# Patient Record
Sex: Female | Born: 1995 | Race: White | Hispanic: No | Marital: Single | Smoking: Never smoker
Health system: Southern US, Community
[De-identification: ages and names within clinical notes are randomized; demographics above are authoritative.]

## PROBLEM LIST (undated history)

## (undated) DIAGNOSIS — B2790 Infectious mononucleosis, unspecified without complication: Secondary | ICD-10-CM

## (undated) HISTORY — PX: OTHER SURGICAL HISTORY: SHX169

---
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
## 2016-06-27 ENCOUNTER — Emergency Department
Admission: EM | Admit: 2016-06-27 | Discharge: 2016-06-27 | Disposition: A | Discharge status: Home / Self Care | Payer: Medicaid Other | Attending: Emergency Medicine | Admitting: Emergency Medicine

## 2016-06-27 ENCOUNTER — Encounter: Payer: Self-pay | Admitting: Emergency Medicine

## 2016-06-27 DIAGNOSIS — Z79899 Other long term (current) drug therapy: Secondary | ICD-10-CM | POA: Diagnosis not present

## 2016-06-27 DIAGNOSIS — R509 Fever, unspecified: Secondary | ICD-10-CM | POA: Diagnosis present

## 2016-06-27 DIAGNOSIS — K1379 Other lesions of oral mucosa: Secondary | ICD-10-CM | POA: Diagnosis not present

## 2016-06-27 DIAGNOSIS — K148 Other diseases of tongue: Secondary | ICD-10-CM

## 2016-06-27 LAB — INFLUENZA PANEL BY PCR (TYPE A & B)
INFLAPCR: NEGATIVE
Influenza B By PCR: NEGATIVE

## 2016-06-27 LAB — POCT RAPID STREP A: Streptococcus, Group A Screen (Direct): NEGATIVE

## 2016-06-27 MED ORDER — LIDOCAINE VISCOUS 2 % MT SOLN
15.0000 mL | Freq: Once | OROMUCOSAL | Status: AC
  Administered 2016-06-27: 15 mL via OROMUCOSAL
  Filled 2016-06-27: qty 15

## 2016-06-27 MED ORDER — CLINDAMYCIN HCL 300 MG PO CAPS: 300.0000 mg | ORAL_CAPSULE | Freq: Three times a day (TID) | ORAL | 0 refills | Status: AC

## 2016-06-27 MED ORDER — DEXAMETHASONE 1 MG/ML PO CONC
10.0000 mg | Freq: Once | ORAL | Status: AC
  Administered 2016-06-27: 10 mg via ORAL
  Filled 2016-06-27: qty 10

## 2016-06-27 MED ORDER — MAGIC MOUTHWASH W/LIDOCAINE: 5.0000 mL | Freq: Three times a day (TID) | ORAL | 0 refills | Status: DC

## 2016-06-27 MED ORDER — CLINDAMYCIN HCL 150 MG PO CAPS
300.0000 mg | ORAL_CAPSULE | Freq: Once | ORAL | Status: AC
  Administered 2016-06-27: 300 mg via ORAL
  Filled 2016-06-27: qty 2

## 2016-06-27 NOTE — ED Triage Notes (Addendum)
Pt ambulatory to triage with steady gait, no distress noted. Pt c/o fever, neck tenderness and sore on tongue x2 days. Pt denies N/V/D.

## 2016-06-27 NOTE — ED Provider Notes (Signed)
[facility] Emergency Department Provider Note   ____________________________________________   First MD Initiated Contact with Patient 06/27/16 0532     (approximate)  I have reviewed the triage vital signs and the nursing notes.   HISTORY  Chief Complaint Fever; Mouth Lesions; and Sore Throat    HPI Krista Huffman is a 21 y.o. female who comes into the hospital today with a fever. She reports on Sunday was up to 102. She reports that she had some pain in her tongue and when she looked it was completely white. She reports that she saw spots on her tongue on Monday. She's been taking Motrin but reports is not helping. She reports that her temperature was improved with the spot on her tongue has been bothering her. Any He tries to eat hurts. The patient also reports that her throat has been hurting her. The patient rates her pain 8 out of 10 in intensity. She denies any sick contacts. She has had mono in the past but has never had anything like this before. She is here today for evaluation.   Past Medical History:  Diagnosis Date  . Mononucleosis     There are no active problems to display for this patient.   History reviewed. No pertinent surgical history.  Prior to Admission medications   Medication Sig Start Date End Date Taking? Authorizing Provider  acetaminophen-codeine (TYLENOL #3) 300-30 MG per tablet Take 1 tablet by mouth every 8 (eight) hours as needed for moderate pain. 01/07/15   Jenise V Bacon Menshew, PA-C  Alum & Mag Hydroxide-Simeth (MAGIC MOUTHWASH W/LIDOCAINE) SOLN Take 5 mLs by mouth 4 (four) times daily. 01/11/15   Joni Reining, PA-C  clindamycin (CLEOCIN) 300 MG capsule Take 1 capsule (300 mg total) by mouth 3 (three) times daily. 06/27/16 07/07/16  Rebecka Apley, MD  ibuprofen (ADVIL,MOTRIN) 200 MG tablet Take 200 mg by mouth every 6 (six) hours as needed for mild pain or moderate pain.    Historical Provider, MD  magic  mouthwash w/lidocaine SOLN Take 5 mLs by mouth 3 (three) times daily. 06/27/16   Rebecka Apley, MD  oxyCODONE (ROXICODONE) 5 MG/5ML solution Take 5 mLs (5 mg total) by mouth every 6 (six) hours as needed for severe pain. 03/14/15   Governor Rooks, MD  predniSONE (DELTASONE) 10 MG tablet Take 1 tablet (10 mg total) by mouth 2 (two) times daily with a meal. 01/07/15   Charlesetta Ivory Menshew, PA-C    Allergies Patient has no known allergies.  History reviewed. No pertinent family history.  Social History Social History  Substance Use Topics  . Smoking status: Never Smoker  . Smokeless tobacco: Never Used  . Alcohol use No    Review of Systems Constitutional:  fever/chills Eyes: No visual changes. ENT:  sore throat, mouth lesion Cardiovascular: Denies chest pain. Respiratory: Denies shortness of breath. Gastrointestinal: No abdominal pain.  No nausea, no vomiting.  No diarrhea.  No constipation. Genitourinary: Negative for dysuria. Musculoskeletal: Negative for back pain. Skin: Negative for rash. Neurological: Negative for headaches, focal weakness or numbness.  10-point ROS otherwise negative.  ____________________________________________   PHYSICAL EXAM:  VITAL SIGNS: ED Triage Vitals  Enc Vitals Group     BP 06/27/16 0415 122/79     Pulse Rate 06/27/16 0415 85     Resp 06/27/16 0415 16     Temp 06/27/16 0415 97.6 F (36.4 C)     Temp Source 06/27/16 0415 Oral  SpO2 06/27/16 0415 98 %     Weight 06/27/16 0415 214 lb (97.1 kg)     Height 06/27/16 0415 5\' 8"  (1.727 m)     Head Circumference --      Peak Flow --      Pain Score 06/27/16 0527 6     Pain Loc --      Pain Edu? --      Excl. in GC? --     Constitutional: Alert and oriented. Well appearing and in Moderate distress. Eyes: Conjunctivae are normal. PERRL. EOMI. Head: Atraumatic. Nose: No congestion/rhinnorhea. Mouth/Throat: Mucous membranes are moist.  Oropharynx non-erythematous. Patient does have  an oval circumscribed appearing lesion in the middle of her tongue. There is some increased whiteness in the middle of the area. It is tender to palpation at his approximately 2 x 3 cm. It is mildly erythematous. Cardiovascular: Normal rate, regular rhythm. Grossly normal heart sounds.  Good peripheral circulation. Respiratory: Normal respiratory effort.  No retractions. Lungs CTAB. Gastrointestinal: Soft and nontender. No distention. Positive bowel sounds Musculoskeletal: No lower extremity tenderness nor edema.   Neurologic:  Normal speech and language.  Skin:  Skin is warm, dry and intact.  Psychiatric: Mood and affect are normal.   ____________________________________________   LABS (all labs ordered are listed, but only abnormal results are displayed)  Labs Reviewed  CULTURE, GROUP A STREP ([facility])  INFLUENZA PANEL BY PCR (TYPE A & B)  POCT RAPID STREP A   ____________________________________________  EKG  none ____________________________________________  RADIOLOGY  none ____________________________________________   PROCEDURES  Procedure(s) performed: None  Procedures  Critical Care performed: No  ____________________________________________   INITIAL IMPRESSION / ASSESSMENT AND PLAN / ED COURSE  Pertinent labs & imaging results that were available during my care of the patient were reviewed by me and considered in my medical decision making (see chart for details).  Did do a strep and a flu swab on the patient. I gave her a dose of tramadol as well as dexamethasone and viscous lidocaine. The patient also received a dose of clindamycin. The lesion does not have a typical appearing of herpes outbreak. I we'll treat the patient was some clindamycin for the possibility of an infection and give her some Magic mouthwash for home. The patient should follow-up with her arm a care physician for further evaluation. If the lesion remains or she has any worsening symptoms  she should return to emergency department for further evaluation.      ____________________________________________   FINAL CLINICAL IMPRESSION(S) / ED DIAGNOSES  Final diagnoses:  Fever, unspecified fever cause  Tongue lesion      NEW MEDICATIONS STARTED DURING THIS VISIT:  New Prescriptions   CLINDAMYCIN (CLEOCIN) 300 MG CAPSULE    Take 1 capsule (300 mg total) by mouth 3 (three) times daily.   MAGIC MOUTHWASH W/LIDOCAINE SOLN    Take 5 mLs by mouth 3 (three) times daily.     Note:  This document was prepared using Dragon voice recognition software and may include unintentional dictation errors.    Rebecka ApleyAllison P Ayven Pheasant, MD 06/27/16 [phone]

## 2016-06-29 LAB — CULTURE, GROUP A STREP (THRC)

## 2016-08-29 ENCOUNTER — Encounter: Payer: Self-pay | Admitting: Emergency Medicine

## 2016-08-29 ENCOUNTER — Emergency Department
Admission: EM | Admit: 2016-08-29 | Discharge: 2016-08-29 | Disposition: A | Discharge status: Home / Self Care | Payer: Medicaid Other | Attending: Emergency Medicine | Admitting: Emergency Medicine

## 2016-08-29 ENCOUNTER — Emergency Department: Payer: Medicaid Other

## 2016-08-29 DIAGNOSIS — Z79899 Other long term (current) drug therapy: Secondary | ICD-10-CM | POA: Insufficient documentation

## 2016-08-29 DIAGNOSIS — J029 Acute pharyngitis, unspecified: Secondary | ICD-10-CM | POA: Diagnosis not present

## 2016-08-29 LAB — CBC WITH DIFFERENTIAL/PLATELET
BASOS ABS: 0.1 10*3/uL (ref 0–0.1)
BASOS PCT: 0 %
EOS ABS: 0 10*3/uL (ref 0–0.7)
EOS PCT: 0 %
HCT: 38.9 % (ref 35.0–47.0)
HEMOGLOBIN: 13.4 g/dL (ref 12.0–16.0)
LYMPHS ABS: 1.9 10*3/uL (ref 1.0–3.6)
Lymphocytes Relative: 8 %
MCH: 28.8 pg (ref 26.0–34.0)
MCHC: 34.4 g/dL (ref 32.0–36.0)
MCV: 83.8 fL (ref 80.0–100.0)
Monocytes Absolute: 1.3 10*3/uL — ABNORMAL HIGH (ref 0.2–0.9)
Monocytes Relative: 5 %
NEUTROS PCT: 87 %
Neutro Abs: 20.2 10*3/uL — ABNORMAL HIGH (ref 1.4–6.5)
Platelets: 274 10*3/uL (ref 150–440)
RBC: 4.64 MIL/uL (ref 3.80–5.20)
RDW: 12.9 % (ref 11.5–14.5)
WBC: 23.5 10*3/uL — AB (ref 3.6–11.0)

## 2016-08-29 LAB — BASIC METABOLIC PANEL
Anion gap: 10 (ref 5–15)
BUN: 7 mg/dL (ref 6–20)
CALCIUM: 9.2 mg/dL (ref 8.9–10.3)
CO2: 19 mmol/L — ABNORMAL LOW (ref 22–32)
CREATININE: 0.74 mg/dL (ref 0.44–1.00)
Chloride: 105 mmol/L (ref 101–111)
GFR calc Af Amer: >60 mL/min (ref >60)
Glucose, Bld: 123 mg/dL — ABNORMAL HIGH (ref 65–99)
Potassium: 3.1 mmol/L — ABNORMAL LOW (ref 3.5–5.1)
Sodium: 134 mmol/L — ABNORMAL LOW (ref 135–145)

## 2016-08-29 LAB — HCG, QUANTITATIVE, PREGNANCY: hCG, Beta Chain, Quant, S: 2 m[IU]/mL (ref <5)

## 2016-08-29 MED ORDER — DEXAMETHASONE SODIUM PHOSPHATE 10 MG/ML IJ SOLN
10.0000 mg | Freq: Once | INTRAMUSCULAR | Status: AC
  Administered 2016-08-29: 10 mg via INTRAVENOUS
  Filled 2016-08-29: qty 1

## 2016-08-29 MED ORDER — IOPAMIDOL (ISOVUE-300) INJECTION 61%
75.0000 mL | Freq: Once | INTRAVENOUS | Status: AC | PRN
  Administered 2016-08-29: 75 mL via INTRAVENOUS

## 2016-08-29 MED ORDER — MORPHINE SULFATE (PF) 4 MG/ML IV SOLN
4.0000 mg | Freq: Once | INTRAVENOUS | Status: AC
  Administered 2016-08-29: 4 mg via INTRAVENOUS
  Filled 2016-08-29: qty 1

## 2016-08-29 MED ORDER — DEXTROSE 5 % IV SOLN: 1.0000 g | Freq: Once | INTRAVENOUS | Status: DC

## 2016-08-29 MED ORDER — CEFTRIAXONE SODIUM-DEXTROSE 1-3.74 GM-% IV SOLR
1.0000 g | Freq: Once | INTRAVENOUS | Status: AC
  Administered 2016-08-29: 1 g via INTRAVENOUS
  Filled 2016-08-29: qty 50

## 2016-08-29 MED ORDER — HYDROCODONE-ACETAMINOPHEN 5-325 MG PO TABS: 1.0000 | ORAL_TABLET | Freq: Four times a day (QID) | ORAL | 0 refills | Status: DC | PRN

## 2016-08-29 MED ORDER — ONDANSETRON HCL 4 MG/2ML IJ SOLN
4.0000 mg | Freq: Once | INTRAMUSCULAR | Status: AC
  Administered 2016-08-29: 4 mg via INTRAVENOUS
  Filled 2016-08-29: qty 2

## 2016-08-29 MED ORDER — SODIUM CHLORIDE 0.9 % IV BOLUS (SEPSIS)
1000.0000 mL | Freq: Once | INTRAVENOUS | Status: AC
  Administered 2016-08-29: 1000 mL via INTRAVENOUS

## 2016-08-29 NOTE — Discharge Instructions (Signed)
From Dr. Shaune Pollack: You were evaluated for worsening strep throat which was diagnosed yesterday. Your exam and evaluation are overall reassuring today. You were given one dose of 24 hour antibiotic. Start your amoxicillin tomorrow. You are given a dose of dexamethasone, steroid to help with swelling and discomfort of your throat.  You were given a prescription for a limited number of tablets of hydrocodone/acetaminophen for severe pain over the next 1-2 days.  Return to the emergency department immediately for any worsening sore throat, trouble breathing, fever, confusion or altered mental status, trouble swallowing, vomiting, or any other symptoms concerning to you.

## 2016-08-29 NOTE — ED Triage Notes (Signed)
Patient presents to the ED with swollen tonsils and sore throat.  Patient was diagnosed with strep throat yesterday and has a history of mono.  Patient is unable to swallow saliva and is unable to speak normally.

## 2016-08-29 NOTE — ED Provider Notes (Signed)
[facility] Emergency Department Provider Note ____________________________________________   I have reviewed the triage vital signs and the triage nursing note.  HISTORY  Chief Complaint Sore Throat   Historian Patient and mom  HPI Krista Huffman is a 21 y.o. female with a history of prior episodes with mononucleosis, reports several days of sore throat, presented to Dr. yesterday and states she had a throat swab and was told that it was positive for strep pharyngitis. She was prescribed amoxicillin and took 1 dose around 3 PM and then around 4 PM took a second dose because she wasn't getting any better.  Mom states she was taking Motrin overnight, and the patient states that she is continued to have worsening sore throat and trouble swallowing. She is swallowing secretions. Pain is moderate to severe.  No fever.  No coughing or trouble breathing.    Past Medical History:  Diagnosis Date  . Mononucleosis     There are no active problems to display for this patient.   History reviewed. No pertinent surgical history.  Prior to Admission medications   Medication Sig Start Date End Date Taking? Authorizing Provider  amoxicillin (AMOXIL) 875 MG tablet Take 875 mg by mouth 2 (two) times daily. Take 875 mg by mouth 2 (two) times daily for 10 days for infection. 08/28/16 09/07/16 Yes Historical Provider, MD  ibuprofen (ADVIL,MOTRIN) 200 MG tablet Take 200 mg by mouth every 6 (six) hours as needed for mild pain or moderate pain.   Yes Historical Provider, MD  acetaminophen-codeine (TYLENOL #3) 300-30 MG per tablet Take 1 tablet by mouth every 8 (eight) hours as needed for moderate pain. Patient not taking: Reported on 08/29/2016 01/07/15   Charlesetta Ivory Menshew, PA-C  Alum & Mag Hydroxide-Simeth (MAGIC MOUTHWASH W/LIDOCAINE) SOLN Take 5 mLs by mouth 4 (four) times daily. Patient not taking: Reported on 08/29/2016 01/11/15   Joni Reining, PA-C  magic mouthwash  w/lidocaine SOLN Take 5 mLs by mouth 3 (three) times daily. Patient not taking: Reported on 08/29/2016 06/27/16   Rebecka Apley, MD  oxyCODONE (ROXICODONE) 5 MG/5ML solution Take 5 mLs (5 mg total) by mouth every 6 (six) hours as needed for severe pain. Patient not taking: Reported on 08/29/2016 03/14/15   Governor Rooks, MD  predniSONE (DELTASONE) 10 MG tablet Take 1 tablet (10 mg total) by mouth 2 (two) times daily with a meal. Patient not taking: Reported on 08/29/2016 01/07/15   Charlesetta Ivory Menshew, PA-C    No Known Allergies  No family history on file.  Social History Social History  Substance Use Topics  . Smoking status: Never Smoker  . Smokeless tobacco: Never Used  . Alcohol use No    Review of Systems  Constitutional: Negative for fever. Eyes: Negative for visual changes. ENT: Positive for sore throat. Cardiovascular: Negative for chest pain. Respiratory: Negative for shortness of breath. Gastrointestinal: Negative for abdominal pain, vomiting and diarrhea. Genitourinary: Negative for dysuria. Musculoskeletal: Negative for back pain. Skin: Negative for rash. Neurological: Negative for headache. 10 point Review of Systems otherwise negative ____________________________________________   PHYSICAL EXAM:  VITAL SIGNS: ED Triage Vitals  Enc Vitals Group     BP 08/29/16 0715 (!) 133/58     Pulse Rate 08/29/16 0715 (!) 119     Resp 08/29/16 0715 18     Temp 08/29/16 0715 98.7 F (37.1 C)     Temp Source 08/29/16 0715 Oral     SpO2 08/29/16 0715 100 %  Weight 08/29/16 0715 238 lb (108 kg)     Height 08/29/16 0715  (1.753 m)     Head Circumference --      Peak Flow --      Pain Score 08/29/16 0714 10     Pain Loc --      Pain Edu? --      Excl. in GC? --      Constitutional: Alert and oriented. Looks like she doesn't feel that well, but no acute distress. HEENT   Head: Normocephalic and atraumatic.      Eyes: Conjunctivae are normal. PERRL.  Normal extraocular movements.      Ears:         Nose: No congestion/rhinnorhea.   Mouth/Throat: Muffled voice.   Tolerating her own secretions.   Mucous membranes are moist.  Kissing tonsils, very swollen, but midline uvula. Positive for tonsillar exudates.  No swelling under the tongue.   Neck: No stridor.  Mild cervical lymphadenopathy bilaterally.  No apparent tracheal deviation. Cardiovascular/Chest: Tachycardic, regular rhythm.  No murmurs, rubs, or gallops. Respiratory: Normal respiratory effort without tachypnea nor retractions. Breath sounds are clear and equal bilaterally. No wheezes/rales/rhonchi. Gastrointestinal: Soft. No distention, no guarding, no rebound. Nontender.   Genitourinary/rectal:Deferred Musculoskeletal: Nontender with normal range of motion in all extremities. No joint effusions.  No lower extremity tenderness.  No edema. Neurologic:  Muffled voice, but no slurred speech or aphasia. No gross or focal neurologic deficits are appreciated. Skin:  Skin is warm, dry and intact. No rash noted. Psychiatric: Mood and affect are normal. Speech and behavior are normal. Patient exhibits appropriate insight and judgment.   ____________________________________________  LABS (pertinent positives/negatives)  Labs Reviewed  BASIC METABOLIC PANEL - Abnormal; Notable for the following:       Result Value   Sodium 134 (*)    Potassium 3.1 (*)    CO2 19 (*)    Glucose, Bld 123 (*)    All other components within normal limits  CBC WITH DIFFERENTIAL/PLATELET - Abnormal; Notable for the following:    WBC 23.5 (*)    Neutro Abs 20.2 (*)    Monocytes Absolute 1.3 (*)    All other components within normal limits  HCG, QUANTITATIVE, PREGNANCY    ____________________________________________    EKG I, Governor Rooks, MD, the attending physician have personally viewed and interpreted all ECGs.  None ____________________________________________  RADIOLOGY All Xrays were  viewed by me. Imaging interpreted by Radiologist.  CT soft tissue neck with contrast:  IMPRESSION: Diffuse inflamed enlarged palatine tonsils which oppose each other and narrow the air column. Findings consistent with diffuse tonsillitis without drainable abscess. Minimal haziness of fat planes of the parapharyngeal region consistent with reactive changes without clear breakthrough of inflammatory process into the parapharyngeal space.  Diffuse bilateral adenopathy with largest lymph nodes level 2 region bilaterally.  These results were called by telephone at the time of interpretation on 08/29/2016 at 9:28 am to Dr. Governor Rooks , who verbally acknowledged these results. __________________________________________  PROCEDURES  Procedure(s) performed: None  Critical Care performed: None  ____________________________________________   ED COURSE / ASSESSMENT AND PLAN  Pertinent labs & imaging results that were available during my care of the patient were reviewed by me and considered in my medical decision making (see chart for details).   She does look pretty miserable with muffled voice although she is tolerating her secretions. She has kissing tonsils with bad exudates. She states that she was tested and told it  was positive for strep yesterday. She took socially double dose of amoxicillin yesterday afternoon.  Pain is not well controlled. She feels like she is getting worse.  She is tachycardic without fever. I discussed obtaining neck CT to rule out posterior deep space infection as a complication of the strep.  I am going to give her dose of dexamethasone for help with symptomatic swelling. I am going to give her dose of IV Rocephin today.   Discussed CT result with radiologist, no complications. Discussed results with patient and mom. She actually feels improved. No tachycardia or fever. She is okay for discharge home at this point. They understand return  precautions.  CONSULTATIONS: None  Patient / Family / Caregiver informed of clinical course, medical decision-making process, and agree with plan.   I discussed return precautions, follow-up instructions, and discharge instructions with patient and/or family.  Discharge instructions: From Dr. Shaune Pollack: You were evaluated for worsening strep throat which was diagnosed yesterday. Your exam and evaluation are overall reassuring today. You were given one dose of 24 hour antibiotic. Start your amoxicillin tomorrow. You are given a dose of dexamethasone, steroid to help with swelling and discomfort of your throat.  You were given a prescription for a limited number of tablets of hydrocodone/acetaminophen for severe pain over the next 1-2 days.  Return to the emergency department immediately for any worsening sore throat, trouble breathing, fever, confusion or altered mental status, trouble swallowing, vomiting, or any other symptoms concerning to you.  Rx: Norco  6 tablets ___________________________________________   FINAL CLINICAL IMPRESSION(S) / ED DIAGNOSES   Final diagnoses:  Pharyngitis, unspecified etiology              Note: This dictation was prepared with Dragon dictation. Any transcriptional errors that result from this process are unintentional    Governor Rooks, MD 08/29/16 1109

## 2016-08-29 NOTE — ED Notes (Signed)
Patient transported to CT 
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
## 2017-03-28 ENCOUNTER — Encounter: Payer: Self-pay | Admitting: Emergency Medicine

## 2017-03-28 ENCOUNTER — Emergency Department
Admission: EM | Admit: 2017-03-28 | Discharge: 2017-03-28 | Disposition: A | Discharge status: Home / Self Care | Payer: Medicaid Other | Attending: Emergency Medicine | Admitting: Emergency Medicine

## 2017-03-28 DIAGNOSIS — S41152A Open bite of left upper arm, initial encounter: Secondary | ICD-10-CM | POA: Diagnosis present

## 2017-03-28 DIAGNOSIS — Y998 Other external cause status: Secondary | ICD-10-CM | POA: Insufficient documentation

## 2017-03-28 DIAGNOSIS — Y929 Unspecified place or not applicable: Secondary | ICD-10-CM | POA: Diagnosis not present

## 2017-03-28 DIAGNOSIS — Y9389 Activity, other specified: Secondary | ICD-10-CM | POA: Insufficient documentation

## 2017-03-28 DIAGNOSIS — Z23 Encounter for immunization: Secondary | ICD-10-CM | POA: Diagnosis not present

## 2017-03-28 MED ORDER — TETANUS-DIPHTH-ACELL PERTUSSIS 5-2.5-18.5 LF-MCG/0.5 IM SUSP
0.5000 mL | Freq: Once | INTRAMUSCULAR | Status: AC
  Administered 2017-03-28: 0.5 mL via INTRAMUSCULAR
  Filled 2017-03-28: qty 0.5

## 2017-03-28 MED ORDER — BACITRACIN ZINC 500 UNIT/GM EX OINT
TOPICAL_OINTMENT | Freq: Two times a day (BID) | CUTANEOUS | Status: DC
  Administered 2017-03-28: 1 via TOPICAL
  Filled 2017-03-28: qty 0.9

## 2017-03-28 MED ORDER — AMOXICILLIN-POT CLAVULANATE 875-125 MG PO TABS: 1.0000 | ORAL_TABLET | Freq: Two times a day (BID) | ORAL | 0 refills | Status: AC

## 2017-03-28 MED ORDER — ONDANSETRON 4 MG PO TBDP: 4.0000 mg | ORAL_TABLET | Freq: Three times a day (TID) | ORAL | 0 refills | Status: DC | PRN

## 2017-03-28 NOTE — ED Notes (Signed)
NAD noted at time of D/C. Pt denies questions or concerns. Pt ambulatory to the lobby at this time.  

## 2017-03-28 NOTE — Discharge Instructions (Signed)
Apply neosporin and cover with gauze for the next 2-3 days.   Take medication as prescribed.   Return to emergency department if symptoms worsen and follow-up with PCP as needed.    You may take over-the-counter Tylenol or ibuprofen as needed for pain.
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
## 2017-08-25 ENCOUNTER — Emergency Department
Admission: EM | Admit: 2017-08-25 | Discharge: 2017-08-25 | Disposition: A | Discharge status: Home / Self Care | Payer: Medicaid Other | Attending: Emergency Medicine | Admitting: Emergency Medicine

## 2017-08-25 ENCOUNTER — Encounter: Payer: Self-pay | Admitting: Emergency Medicine

## 2017-08-25 ENCOUNTER — Other Ambulatory Visit: Payer: Self-pay

## 2017-08-25 ENCOUNTER — Emergency Department: Payer: Medicaid Other

## 2017-08-25 DIAGNOSIS — J020 Streptococcal pharyngitis: Secondary | ICD-10-CM | POA: Insufficient documentation

## 2017-08-25 DIAGNOSIS — R070 Pain in throat: Secondary | ICD-10-CM | POA: Diagnosis present

## 2017-08-25 LAB — COMPREHENSIVE METABOLIC PANEL
ALT: 20 U/L (ref 14–54)
AST: 27 U/L (ref 15–41)
Albumin: 4.5 g/dL (ref 3.5–5.0)
Alkaline Phosphatase: 57 U/L (ref 38–126)
Anion gap: 12 (ref 5–15)
BUN: 10 mg/dL (ref 6–20)
CO2: 22 mmol/L (ref 22–32)
Calcium: 9.5 mg/dL (ref 8.9–10.3)
Chloride: 102 mmol/L (ref 101–111)
Creatinine, Ser: 0.8 mg/dL (ref 0.44–1.00)
GFR calc Af Amer: >60 mL/min (ref >60)
GFR calc non Af Amer: >60 mL/min (ref >60)
Glucose, Bld: 100 mg/dL — ABNORMAL HIGH (ref 65–99)
Potassium: 3.9 mmol/L (ref 3.5–5.1)
Sodium: 136 mmol/L (ref 135–145)
Total Bilirubin: 0.9 mg/dL (ref 0.3–1.2)
Total Protein: 8.6 g/dL — ABNORMAL HIGH (ref 6.5–8.1)

## 2017-08-25 LAB — CBC WITH DIFFERENTIAL/PLATELET
Basophils Absolute: 0.1 10*3/uL (ref 0–0.1)
Basophils Relative: 0 %
Eosinophils Absolute: 0.1 10*3/uL (ref 0–0.7)
Eosinophils Relative: 0 %
HCT: 39.6 % (ref 35.0–47.0)
Hemoglobin: 13.4 g/dL (ref 12.0–16.0)
Lymphocytes Relative: 12 %
Lymphs Abs: 2.7 10*3/uL (ref 1.0–3.6)
MCH: 28.7 pg (ref 26.0–34.0)
MCHC: 34 g/dL (ref 32.0–36.0)
MCV: 84.5 fL (ref 80.0–100.0)
Monocytes Absolute: 1.2 10*3/uL — ABNORMAL HIGH (ref 0.2–0.9)
Monocytes Relative: 5 %
Neutro Abs: 18.2 10*3/uL — ABNORMAL HIGH (ref 1.4–6.5)
Neutrophils Relative %: 83 %
Platelets: 342 10*3/uL (ref 150–440)
RBC: 4.68 MIL/uL (ref 3.80–5.20)
RDW: 12.6 % (ref 11.5–14.5)
WBC: 22.2 10*3/uL — ABNORMAL HIGH (ref 3.6–11.0)

## 2017-08-25 LAB — GROUP A STREP BY PCR: GROUP A STREP BY PCR: DETECTED — AB

## 2017-08-25 LAB — POC URINE PREG, ED: PREG TEST UR: NEGATIVE

## 2017-08-25 MED ORDER — IOPAMIDOL (ISOVUE-300) INJECTION 61%
75.0000 mL | Freq: Once | INTRAVENOUS | Status: AC | PRN
  Administered 2017-08-25: 75 mL via INTRAVENOUS
  Filled 2017-08-25: qty 75

## 2017-08-25 MED ORDER — SODIUM CHLORIDE 0.9 % IV BOLUS
1000.0000 mL | Freq: Once | INTRAVENOUS | Status: AC
  Administered 2017-08-25: 1000 mL via INTRAVENOUS

## 2017-08-25 MED ORDER — SODIUM CHLORIDE 0.9 % IV SOLN
3.0000 g | Freq: Once | INTRAVENOUS | Status: AC
  Administered 2017-08-25: 3 g via INTRAVENOUS
  Filled 2017-08-25: qty 3

## 2017-08-25 MED ORDER — AMOXICILLIN 875 MG PO TABS: 875.0000 mg | ORAL_TABLET | Freq: Two times a day (BID) | ORAL | 0 refills | Status: AC

## 2017-08-25 NOTE — ED Triage Notes (Signed)
Sore throat started this am

## 2017-08-25 NOTE — ED Provider Notes (Signed)
[facility] Emergency Department Provider Note  ____________________________________________  Time seen: Approximately 9:35 PM  I have reviewed the triage vital signs and the nursing notes.   HISTORY  Chief Complaint Sore Throat    HPI Krista Huffman is a 22 y.o. female presents to the emergency department with pharyngitis, headache and nausea for the past 1 day.  Patient reports pains with swallowing and has noticed changes in voice.  Patient is speaking in complete sentences in the emergency department.  No prior history of peritonsillar abscesses.   Past Medical History:  Diagnosis Date  . Mononucleosis     There are no active problems to display for this patient.   History reviewed. No pertinent surgical history.  Prior to Admission medications   Medication Sig Start Date End Date Taking? Authorizing Provider  acetaminophen-codeine (TYLENOL #3) 300-30 MG per tablet Take 1 tablet by mouth every 8 (eight) hours as needed for moderate pain. Patient not taking: Reported on 08/29/2016 01/07/15   Menshew, Charlesetta Ivory, PA-C  Alum & Mag Hydroxide-Simeth (MAGIC MOUTHWASH W/LIDOCAINE) SOLN Take 5 mLs by mouth 4 (four) times daily. Patient not taking: Reported on 08/29/2016 01/11/15   Joni Reining, PA-C  amoxicillin (AMOXIL) 875 MG tablet Take 1 tablet (875 mg total) by mouth 2 (two) times daily for 10 days. 08/25/17 09/04/17  Orvil Feil, PA-C  HYDROcodone-acetaminophen (NORCO/VICODIN) 5-325 MG tablet Take 1 tablet by mouth every 6 (six) hours as needed for moderate pain. 08/29/16   Governor Rooks, MD  ibuprofen (ADVIL,MOTRIN) 200 MG tablet Take 200 mg by mouth every 6 (six) hours as needed for mild pain or moderate pain.    [provider]  magic mouthwash w/lidocaine SOLN Take 5 mLs by mouth 3 (three) times daily. Patient not taking: Reported on 08/29/2016 06/27/16   Rebecka Apley, MD  oxyCODONE (ROXICODONE) 5 MG/5ML solution Take 5 mLs (5 mg  total) by mouth every 6 (six) hours as needed for severe pain. Patient not taking: Reported on 08/29/2016 03/14/15   Governor Rooks, MD  predniSONE (DELTASONE) 10 MG tablet Take 1 tablet (10 mg total) by mouth 2 (two) times daily with a meal. Patient not taking: Reported on 08/29/2016 01/07/15   Menshew, Charlesetta Ivory, PA-C    Allergies Patient has no known allergies.  No family history on file.  Social History Social History   Tobacco Use  . Smoking status: Never Smoker  . Smokeless tobacco: Never Used  Substance Use Topics  . Alcohol use: No  . Drug use: No     Review of Systems  Constitutional: Patient has fever.  Eyes: No visual changes. No discharge ENT: Patient has pharyngitis.  Cardiovascular: no chest pain. Respiratory: no cough. No SOB. Gastrointestinal: No abdominal pain.  No nausea, no vomiting.  No diarrhea.  No constipation. Musculoskeletal: Negative for musculoskeletal pain. Skin: Negative for rash, abrasions, lacerations, ecchymosis. Neurological: Negative for headaches, focal weakness or numbness.   ____________________________________________   PHYSICAL EXAM:  VITAL SIGNS: ED Triage Vitals  Enc Vitals Group     BP 08/25/17 1836 118/87     Pulse Rate 08/25/17 1836 (!) 112     Resp 08/25/17 1836 20     Temp 08/25/17 1836 99.6 F (37.6 C)     Temp Source 08/25/17 1836 Oral     SpO2 08/25/17 1836 97 %     Weight 08/25/17 1837 240 lb (108.9 kg)     Height 08/25/17 1837 5\' 8"  (  1.727 m)     Head Circumference --      Peak Flow --      Pain Score 08/25/17 1837 9     Pain Loc --      Pain Edu? --      Excl. in [place]? --      Constitutional: Alert and oriented. Well appearing and in no acute distress. Eyes: Conjunctivae are normal. PERRL. EOMI. Head: Atraumatic. ENT:      Ears: TMs are pearly.       Nose: No congestion/rhinnorhea.      Mouth/Throat: Mucous membranes are moist. Right tonsil is much larger than left. Uvula is deviated. Tonsillar exudate  visualized.  Hematological/Lymphatic/Immunilogical: Palpable cervical lymphadenopathy.  Cardiovascular: Normal rate, regular rhythm. Normal S1 and S2.  Good peripheral circulation. Respiratory: Normal respiratory effort without tachypnea or retractions. Lungs CTAB. Good air entry to the bases with no decreased or absent breath sounds. Musculoskeletal: Full range of motion to all extremities. No gross deformities appreciated. Neurologic:  Normal speech and language. No gross focal neurologic deficits are appreciated.  Skin:  Skin is warm, dry and intact. No rash noted.   ____________________________________________   LABS (all labs ordered are listed, but only abnormal results are displayed)  Labs Reviewed  GROUP A STREP BY PCR - Abnormal; Notable for the following components:      Result Value   Group A Strep by PCR DETECTED (*)    All other components within normal limits  CBC WITH DIFFERENTIAL/PLATELET - Abnormal; Notable for the following components:   WBC 22.2 (*)    Neutro Abs 18.2 (*)    Monocytes Absolute 1.2 (*)    All other components within normal limits  COMPREHENSIVE METABOLIC PANEL - Abnormal; Notable for the following components:   Glucose, Bld 100 (*)    Total Protein 8.6 (*)    All other components within normal limits  POC URINE PREG, ED   ____________________________________________  EKG   ____________________________________________  RADIOLOGY Geraldo PitterI, Odella Appelhans M [place] Kretz, personally viewed and evaluated these images  as part of my medical decision making, as well as reviewing the written report by the radiologist.    Ct Soft Tissue Neck W Contrast  Result Date: 08/25/2017 CLINICAL DATA:  Sore throat beginning this morning. EXAM: CT NECK WITH CONTRAST TECHNIQUE: Multidetector CT imaging of the neck was performed using the standard protocol following the bolus administration of intravenous contrast. CONTRAST:  75mL ISOVUE-300 IOPAMIDOL (ISOVUE-300) INJECTION 61%  COMPARISON:  CT neck August 29, 2016 FINDINGS: PHARYNX AND LARYNX: Symmetrically enlarged palatine tonsils with striated enhancement focally narrowing the airway. Preservation of parapharyngeal fat planes. Normal epiglottis. Normal larynx. SALIVARY GLANDS: Normal. THYROID: Normal. LYMPH NODES: Bilateral reniform homogeneously enhancing level 2 a lymph nodes measuring to 14 mm short axis. VASCULAR: Normal. LIMITED INTRACRANIAL: Normal. VISUALIZED ORBITS: Normal. MASTOIDS AND VISUALIZED PARANASAL SINUSES: Well-aerated. SKELETON: Nonacute. UPPER CHEST: Patchy ground-glass opacities RIGHT upper lobe. Small amount of residual thymic tissue. OTHER: None. IMPRESSION: 1. Acute tonsillitis focally narrowing the airway. No peritonsillar abscess. 2. Reactive cervical lymphadenopathy. 3. Faint RIGHT upper lobe ground-glass opacities concerning for early pneumonia. Electronically Signed   By: Awilda Metroourtnay  Bloomer M.D.   On: 08/25/2017 22:22    ____________________________________________    PROCEDURES  Procedure(s) performed:    Procedures    Medications  Ampicillin-Sulbactam (UNASYN) 3 g in sodium chloride 0.9 % 100 mL IVPB (3 g Intravenous New Bag/Given 08/25/17 2236)  sodium chloride 0.9 % bolus 1,000 mL (1,000 mLs Intravenous  New Bag/Given 08/25/17 2123)  iopamidol (ISOVUE-300) 61 % injection 75 mL (75 mLs Intravenous Contrast Given 08/25/17 2204)     ____________________________________________   INITIAL IMPRESSION / ASSESSMENT AND PLAN / ED COURSE  Pertinent labs & imaging results that were available during my care of the patient were reviewed by me and considered in my medical decision making (see chart for details).  Review of the [place] CSRS was performed in accordance of the [facility] prior to dispensing any controlled drugs.     Assessment and Plan: Strep pharyngitis Patient presents to the emergency department with headache, pharyngitis and abdominal discomfort.  Differential diagnosis originally  included strep pharyngitis versus peritonsillar abscess.  On physical exam right tonsil was significantly larger than left.  CT soft tissue neck revealed tonsillitis but no peritonsillar abscess.  Patient was given supplemental fluids and Unasyn in the emergency department.  She was discharged with amoxicillin.  Return precautions were given.  All patient questions were answered.   ____________________________________________  FINAL CLINICAL IMPRESSION(S) / ED DIAGNOSES  Final diagnoses:  Strep pharyngitis      NEW MEDICATIONS STARTED DURING THIS VISIT:  ED Discharge Orders        Ordered    amoxicillin (AMOXIL) 875 MG tablet  2 times daily     08/25/17 2229          This chart was dictated using voice recognition software/Dragon. Despite best efforts to proofread, errors can occur which can change the meaning. Any change was purely unintentional.    Orvil Feil, PA-C 08/25/17 2251    Minna Antis, MD 08/25/17 [phone]

## 2018-11-23 ENCOUNTER — Other Ambulatory Visit: Payer: Self-pay | Admitting: Family Medicine

## 2018-11-23 DIAGNOSIS — Z20822 Contact with and (suspected) exposure to covid-19: Secondary | ICD-10-CM

## 2018-11-29 LAB — NOVEL CORONAVIRUS, NAA: SARS-CoV-2, NAA: NOT DETECTED

## 2018-12-12 NOTE — Addendum Note (Signed)
Addended by: Brigitte Pulse on: 12/12/2018 08:04 PM   Modules accepted: Orders

## 2019-07-21 ENCOUNTER — Ambulatory Visit: Payer: Medicaid Other | Attending: Internal Medicine

## 2019-07-21 DIAGNOSIS — Z20822 Contact with and (suspected) exposure to covid-19: Secondary | ICD-10-CM

## 2019-07-22 LAB — NOVEL CORONAVIRUS, NAA: SARS-CoV-2, NAA: NOT DETECTED

## 2019-07-22 LAB — SPECIMEN STATUS REPORT

## 2019-07-29 ENCOUNTER — Ambulatory Visit: Payer: Medicaid Other | Attending: Internal Medicine

## 2019-07-29 DIAGNOSIS — Z20822 Contact with and (suspected) exposure to covid-19: Secondary | ICD-10-CM

## 2019-07-30 LAB — NOVEL CORONAVIRUS, NAA: SARS-CoV-2, NAA: NOT DETECTED

## 2019-08-24 ENCOUNTER — Encounter: Payer: Self-pay | Admitting: Emergency Medicine

## 2019-08-24 ENCOUNTER — Other Ambulatory Visit: Payer: Self-pay

## 2019-08-24 ENCOUNTER — Emergency Department
Admission: EM | Admit: 2019-08-24 | Discharge: 2019-08-24 | Disposition: A | Discharge status: Home / Self Care | Payer: Medicaid Other | Attending: Emergency Medicine | Admitting: Emergency Medicine

## 2019-08-24 DIAGNOSIS — Z79899 Other long term (current) drug therapy: Secondary | ICD-10-CM | POA: Insufficient documentation

## 2019-08-24 DIAGNOSIS — J039 Acute tonsillitis, unspecified: Secondary | ICD-10-CM

## 2019-08-24 DIAGNOSIS — Z20822 Contact with and (suspected) exposure to covid-19: Secondary | ICD-10-CM | POA: Insufficient documentation

## 2019-08-24 LAB — GROUP A STREP BY PCR: Group A Strep by PCR: NOT DETECTED

## 2019-08-24 LAB — SARS CORONAVIRUS 2 (TAT 6-24 HRS): SARS Coronavirus 2: NEGATIVE

## 2019-08-24 MED ORDER — AMOXICILLIN-POT CLAVULANATE 875-125 MG PO TABS: 1.0000 | ORAL_TABLET | Freq: Two times a day (BID) | ORAL | 0 refills | Status: AC

## 2019-08-24 NOTE — ED Provider Notes (Signed)
[facility] Emergency Department Provider Note  ____________________________________________  Time seen: Approximately 7:21 AM  I have reviewed the triage vital signs and the nursing notes.   HISTORY  Chief Complaint Sore Throat    HPI Krista Huffman is a 24 y.o. female that presents to the emergency department for evaluation of sore throat and left ear pain since this morning.  Patient looked in the mirror this morning and noticed white patches on her tonsils.  She has a history of mono.  Patient works at [employer].  No known contacts with COVID-19.  No fever, nasal congestion, shortness of breath, cough.   Past Medical History:  Diagnosis Date  . Mononucleosis     There are no problems to display for this patient.   History reviewed. No pertinent surgical history.  Prior to Admission medications   Medication Sig Start Date End Date Taking? Authorizing Provider  acetaminophen-codeine (TYLENOL #3) 300-30 MG per tablet Take 1 tablet by mouth every 8 (eight) hours as needed for moderate pain. Patient not taking: Reported on 08/29/2016 01/07/15   Menshew, Charlesetta Ivory, PA-C  Alum & Mag Hydroxide-Simeth (MAGIC MOUTHWASH W/LIDOCAINE) SOLN Take 5 mLs by mouth 4 (four) times daily. Patient not taking: Reported on 08/29/2016 01/11/15   Joni Reining, PA-C  amoxicillin-clavulanate (AUGMENTIN) 875-125 MG tablet Take 1 tablet by mouth 2 (two) times daily for 10 days. 08/24/19 09/03/19  Enid Derry, PA-C  HYDROcodone-acetaminophen (NORCO/VICODIN) 5-325 MG tablet Take 1 tablet by mouth every 6 (six) hours as needed for moderate pain. 08/29/16   Governor Rooks, MD  ibuprofen (ADVIL,MOTRIN) 200 MG tablet Take 200 mg by mouth every 6 (six) hours as needed for mild pain or moderate pain.    [provider]  magic mouthwash w/lidocaine SOLN Take 5 mLs by mouth 3 (three) times daily. Patient not taking: Reported on 08/29/2016 06/27/16   Rebecka Apley, MD  oxyCODONE  (ROXICODONE) 5 MG/5ML solution Take 5 mLs (5 mg total) by mouth every 6 (six) hours as needed for severe pain. Patient not taking: Reported on 08/29/2016 03/14/15   Governor Rooks, MD  predniSONE (DELTASONE) 10 MG tablet Take 1 tablet (10 mg total) by mouth 2 (two) times daily with a meal. Patient not taking: Reported on 08/29/2016 01/07/15   Menshew, Charlesetta Ivory, PA-C    Allergies Patient has no known allergies.  No family history on file.  Social History Social History   Tobacco Use  . Smoking status: Never Smoker  . Smokeless tobacco: Never Used  Substance Use Topics  . Alcohol use: No  . Drug use: No     Review of Systems  Constitutional: No fever/chills Eyes: No visual changes. No discharge. ENT: Negative for congestion and rhinorrhea. Positive for sore throat. Cardiovascular: No chest pain. Respiratory: Negative for cough. No SOB. Gastrointestinal: No abdominal pain.  No nausea, no vomiting.  No diarrhea.  No constipation. Musculoskeletal: Negative for musculoskeletal pain. Skin: Negative for rash, abrasions, lacerations, ecchymosis. Neurological: Negative for headaches.   ____________________________________________   PHYSICAL EXAM:  VITAL SIGNS: ED Triage Vitals  Enc Vitals Group     BP 08/24/19 0709 (!) 117/47     Pulse Rate 08/24/19 0709 91     Resp 08/24/19 0709 16     Temp 08/24/19 0709 99.3 F (37.4 C)     Temp Source 08/24/19 0709 Oral     SpO2 08/24/19 0709 98 %     Weight 08/24/19 0710 236 lb (107  kg)     Height 08/24/19 0710 5\' 9"  (1.753 m)     Head Circumference --      Peak Flow --      Pain Score 08/24/19 0710 8     Pain Loc --      Pain Edu? --      Excl. in [place]? --      Constitutional: Alert and oriented. Well appearing and in no acute distress. Eyes: Conjunctivae are normal. PERRL. EOMI. No discharge. Head: Atraumatic. ENT: No frontal and maxillary sinus tenderness.      Ears: Tympanic membranes pearly gray with good landmarks. No  discharge.      Nose: No congestion/rhinnorhea.      Mouth/Throat: Mucous membranes are moist. Oropharynx erythematous. Tonsils not enlarged. Exudates bilaterally. Uvula midline. Neck: No stridor.   Hematological/Lymphatic/Immunilogical: No cervical lymphadenopathy. Cardiovascular: Normal rate, regular rhythm.  Good peripheral circulation. Respiratory: Normal respiratory effort without tachypnea or retractions. Lungs CTAB. Good air entry to the bases with no decreased or absent breath sounds. Gastrointestinal: Bowel sounds 4 quadrants. Soft and nontender to palpation. No guarding or rigidity. No palpable masses. No distention. Musculoskeletal: Full range of motion to all extremities. No gross deformities appreciated. Neurologic:  Normal speech and language. No gross focal neurologic deficits are appreciated.  Skin:  Skin is warm, dry and intact. No rash noted. Psychiatric: Mood and affect are normal. Speech and behavior are normal. Patient exhibits appropriate insight and judgement.   ____________________________________________   LABS (all labs ordered are listed, but only abnormal results are displayed)  Labs Reviewed  GROUP A STREP BY PCR  SARS CORONAVIRUS 2 (TAT 6-24 HRS)   ____________________________________________  EKG   ____________________________________________  RADIOLOGY   No results found.  ____________________________________________    PROCEDURES  Procedure(s) performed:    Procedures    Medications - No data to display   ____________________________________________   INITIAL IMPRESSION / ASSESSMENT AND PLAN / ED COURSE  Pertinent labs & imaging results that were available during my care of the patient were reviewed by me and considered in my medical decision making (see chart for details).  Review of the [place] CSRS was performed in accordance of the [place] prior to dispensing any controlled drugs.     Patient's diagnosis is consistent with  tonsillitis. Vital signs and exam are reassuring. Patient appears well and is staying well hydrated. Patient should alternate tylenol and ibuprofen for fever. Patient feels comfortable going home. Strep test is negative. Covid test is in process. Patient will be discharged home with prescriptions for Augmentin. Patient is to follow up with PCP as needed or otherwise directed. Patient is given ED precautions to return to the ED for any worsening or new symptoms.  Krista Huffman was evaluated in Emergency Department on 08/24/2019 for the symptoms described in the history of present illness. She was evaluated in the context of the global COVID-19 pandemic, which necessitated consideration that the patient might be at risk for infection with the SARS-CoV-2 virus that causes COVID-19. Institutional protocols and algorithms that pertain to the evaluation of patients at risk for COVID-19 are in a state of rapid change based on information released by regulatory bodies including the CDC and federal and state organizations. These policies and algorithms were followed during the patient's care in the ED.   ____________________________________________  FINAL CLINICAL IMPRESSION(S) / ED DIAGNOSES  Final diagnoses:  Tonsillitis      NEW MEDICATIONS STARTED DURING THIS VISIT:  ED Discharge Orders  Ordered    amoxicillin-clavulanate (AUGMENTIN) 875-125 MG tablet  2 times daily     08/24/19 0726              This chart was dictated using voice recognition software/Dragon. Despite best efforts to proofread, errors can occur which can change the meaning. Any change was purely unintentional.    Enid Derry, PA-C 08/24/19 1005    Chesley Noon, MD 08/24/19 1120

## 2019-08-24 NOTE — ED Triage Notes (Signed)
Pt states that she has had sore throat since yesterday. Pt states that she has white pockets on her tonsils. Pt is also having left ear pain. Pt is in NAD.

## 2019-08-24 NOTE — ED Notes (Signed)
ED Provider at bedside. 

## 2019-08-28 ENCOUNTER — Ambulatory Visit: Payer: Self-pay

## 2020-03-08 ENCOUNTER — Ambulatory Visit (LOCAL_COMMUNITY_HEALTH_CENTER): Payer: Self-pay

## 2020-03-08 ENCOUNTER — Other Ambulatory Visit: Payer: Self-pay

## 2020-03-08 VITALS — BP 121/81 | Ht 69.0 in | Wt 242.5 lb

## 2020-03-08 DIAGNOSIS — Z3201 Encounter for pregnancy test, result positive: Secondary | ICD-10-CM

## 2020-03-08 LAB — PREGNANCY, URINE: Preg Test, Ur: POSITIVE — AB

## 2020-03-08 MED ORDER — PRENATAL VITAMIN 27-0.8 MG PO TABS: 1.0000 | ORAL_TABLET | Freq: Every day | ORAL | 0 refills | Status: AC

## 2020-03-08 NOTE — Progress Notes (Signed)
Pt desires prenatal care at [facility]; sent to preadmit. 

## 2020-03-19 NOTE — Progress Notes (Signed)
Abstracted per Hal Hope telephone interview.Burt Knack, RN

## 2020-03-22 ENCOUNTER — Telehealth: Payer: Self-pay

## 2020-03-22 ENCOUNTER — Encounter: Payer: Self-pay | Admitting: Advanced Practice Midwife

## 2020-03-22 ENCOUNTER — Other Ambulatory Visit: Payer: Self-pay

## 2020-03-22 ENCOUNTER — Other Ambulatory Visit: Payer: Self-pay | Admitting: Advanced Practice Midwife

## 2020-03-22 ENCOUNTER — Ambulatory Visit: Payer: Medicaid Other | Admitting: Advanced Practice Midwife

## 2020-03-22 DIAGNOSIS — O0990 Supervision of high risk pregnancy, unspecified, unspecified trimester: Secondary | ICD-10-CM

## 2020-03-22 DIAGNOSIS — O99210 Obesity complicating pregnancy, unspecified trimester: Secondary | ICD-10-CM | POA: Insufficient documentation

## 2020-03-22 DIAGNOSIS — Z23 Encounter for immunization: Secondary | ICD-10-CM

## 2020-03-22 DIAGNOSIS — Z369 Encounter for antenatal screening, unspecified: Secondary | ICD-10-CM

## 2020-03-22 DIAGNOSIS — Z550 Illiteracy and low-level literacy: Secondary | ICD-10-CM | POA: Insufficient documentation

## 2020-03-22 DIAGNOSIS — E669 Obesity, unspecified: Secondary | ICD-10-CM

## 2020-03-22 DIAGNOSIS — Z3401 Encounter for supervision of normal first pregnancy, first trimester: Secondary | ICD-10-CM | POA: Diagnosis not present

## 2020-03-22 DIAGNOSIS — F1290 Cannabis use, unspecified, uncomplicated: Secondary | ICD-10-CM | POA: Insufficient documentation

## 2020-03-22 HISTORY — DX: Supervision of high risk pregnancy, unspecified, unspecified trimester: O09.90

## 2020-03-22 HISTORY — DX: Obesity complicating pregnancy, unspecified trimester: O99.210

## 2020-03-22 LAB — URINALYSIS
Bilirubin, UA: NEGATIVE
Glucose, UA: NEGATIVE
Ketones, UA: NEGATIVE
Leukocytes,UA: NEGATIVE
Nitrite, UA: NEGATIVE
Protein,UA: NEGATIVE
Specific Gravity, UA: 1.03 (ref 1.005–1.030)
Urobilinogen, Ur: 0.2 mg/dL (ref 0.2–1.0)
pH, UA: 6 (ref 5.0–7.5)

## 2020-03-22 LAB — WET PREP FOR TRICH, YEAST, CLUE
Trichomonas Exam: NEGATIVE
Yeast Exam: NEGATIVE

## 2020-03-22 LAB — HEMOGLOBIN, FINGERSTICK: Hemoglobin: 12.8 g/dL (ref 11.1–15.9)

## 2020-03-22 NOTE — Progress Notes (Signed)
[place]et [place] results reviewed. Per standing orders no treatment indicated. Yordin Rhoda, RN  

## 2020-03-22 NOTE — Progress Notes (Signed)
[facility] HEALTH DEPT [place] [facility] [address] [place] [address] [phone]  INITIAL PRENATAL VISIT NOTE  Subjective:  Krista Huffman is a 24 y.o. SHF nonsmoker G1P0000 at [redacted]w[redacted]d being seen today to start prenatal care at the [facility] Department. She feels "happy" about surprise pregnancy with no birth control.  24 yo FOB feels "we really don't talk; I don't know if he knows--I told him on social media but I don't have his phone # and he's not contacting me, I'm not sure where he lives, maybe in [facility]?".  She says they have been off and on x 1 year but she claims she doesn't know anything about him and has no way to contact him except through social media.  LMP unsure maybe around 01/01/20?Marland Kitchen  She has completed 9th grade, is unemployed, not in school, and living with her employed mom. Her Dad was deported to [place] when she was small. Onset coitus age 40 with 7 sex partners.  Last sex 02/21/20.  Last MJ 02/28/20 q weekend.  Denies ER use or u/s this pregnancy.  Denies cigsm, vaping, cigars. She is currently monitored for the following issues for this low-risk pregnancy and has Obesity BMI=35.7 242 lbs; Encounter for supervision of normal first pregnancy in first trimester; and Marijuana use q weekend on their problem list.  Patient reports nausea without vomiting..  Contractions: Not present. Vag. Bleeding: None.  Movement: Absent. Denies leaking of fluid.   Indications for ASA therapy (per uptodate) One of the following: Previous pregnancy with preeclampsia, especially early onset and with an adverse outcome No Multifetal gestation No Chronic hypertension No Type 1 or 2 diabetes mellitus No Chronic kidney disease No Autoimmune disease (antiphospholipid syndrome, systemic lupus erythematosus) No  Two or more of the following: Nulliparity Yes Obesity (body mass index >30 kg/m2) Yes Family history of preeclampsia in mother  or sister No Age ?35 years No Sociodemographic characteristics (African American race, low socioeconomic level) Yes Personal risk factors (eg, previous pregnancy with low birth weight or small for gestational age infant, previous adverse pregnancy outcome [eg, stillbirth], interval >10 years between pregnancies) No   The following portions of the patient's history were reviewed and updated as appropriate: allergies, current medications, past family history, past medical history, past social history, past surgical history and problem list. Problem list updated.  Objective:   Vitals:   03/22/20 0835  BP: 112/70  Pulse: 71  Temp: 98.3 F (36.8 C)  Weight: 242 lb (109.8 kg)    Fetal Status: Fetal Heart Rate (bpm): not heard Fundal Height: 8 cm Movement: Absent  Presentation: Undeterminable   Physical Exam Vitals and nursing note reviewed.  Constitutional:      General: She is not in acute distress.    Appearance: Normal appearance. She is well-developed. She is obese.  HENT:     Head: Normocephalic and atraumatic.     Right Ear: External ear normal.     Left Ear: External ear normal.     Nose: Nose normal. No congestion or rhinorrhea.     Mouth/Throat:     Lips: Pink.     Mouth: Mucous membranes are moist.     Dentition: Normal dentition. No dental caries.     Pharynx: Oropharynx is clear. Uvula midline.  Eyes:     General: No scleral icterus.    Conjunctiva/sclera: Conjunctivae normal.  Neck:     Thyroid: No thyroid mass or thyromegaly.  Cardiovascular:     Rate and Rhythm: Normal rate.     Pulses: Normal pulses.     Comments: Extremities are warm and well perfused Pulmonary:     Effort: Pulmonary effort is normal.     Breath sounds: Normal breath sounds.  Chest:     Breasts: Breasts are symmetrical.        Right: Normal. No mass, nipple discharge or skin change.        Left: Normal. No mass, nipple discharge or skin change.  Abdominal:     Palpations: Abdomen is  soft.     Tenderness: There is no abdominal tenderness.     Comments: Gravid, poor tone, soft without masses or tenderness, increased adipose  Genitourinary:    General: Normal vulva.     Exam position: Lithotomy position.     Pubic Area: No rash.      Labia:        Right: No rash.        Left: No rash.      Vagina: Normal. No vaginal discharge (white creamy leukorrhea, ph<4.5).     Cervix: Normal.     Uterus: Normal. Enlarged (Gravid 6-8 wks but difficult to assess due to increased adipose tissue). Not tender.      Rectum: Normal. No external hemorrhoid.  Musculoskeletal:     Right lower leg: No edema.     Left lower leg: No edema.  Lymphadenopathy:     Upper Body:     Right upper body: No axillary adenopathy.     Left upper body: No axillary adenopathy.  Skin:    General: Skin is warm.     Capillary Refill: Capillary refill takes less than 2 seconds.     Comments: TNTC tattoos all over body Chest piercing  Neurological:     Mental Status: She is alert.     Assessment and Plan:  Pregnancy: G1P0000 at [redacted]w[redacted]d  1. Obesity, unspecified classification, unspecified obesity type, unspecified whether serious comorbidity present Counseled on wt gain of 11-20 lbs this pregnancy Accepts ASA 81 mg to begin taking next week  2. Encounter for supervision of normal first pregnancy in first trimester Desires FIRST screen--ordered 1 hour glucola done States nausea only without vomiting; suggestions given - Pap IG (Image Guided) - [id number] Drug Screen - WET PREP FOR TRICH, YEAST, CLUE - Hemoglobin, venipuncture - Urinalysis (Urine Dip)  3. Marijuana use q weekend Last use 02/28/20--counseled not to use in pregnancy    Discussed overview of care and coordination with inpatient delivery practices including [facility], [facility], [facility] and [facility].   Reviewed Centering pregnancy as standard of care at [facility], oriented to room and showed video. Based on EDD, plan for Cycle  .     Preterm labor symptoms and general obstetric precautions including but not limited to vaginal bleeding, contractions, leaking of fluid and fetal movement were reviewed in detail with the patient.  Please refer to After Visit Summary for other counseling recommendations.   Return in about 4 weeks (around 04/19/2020) for routine PNC.  No future appointments.  Alberteen Spindle, CNM

## 2020-03-22 NOTE — Progress Notes (Signed)
Here today for 11.2 week MH IP. Taking PNV QD. Denies ED/hospital visits since +PT. Flu vaccine given and tolerated well. Early 1hgtt today. Tawny Hopping, RN

## 2020-03-22 NOTE — Telephone Encounter (Signed)
Phone call to patient at [phone] to inform of scheduled [facility] MFM appt. Informed to arrive at [facility] for registration at 12:45 for scheduled appt for 04/01/20 GC @ 1:00 and [place] and 2:00. Patient verbalized understanding of above. Tawny Hopping, RN

## 2020-03-23 ENCOUNTER — Telehealth: Payer: Self-pay

## 2020-03-23 LAB — COMPREHENSIVE METABOLIC PANEL
ALT: 30 IU/L (ref 0–32)
AST: 23 IU/L (ref 0–40)
Albumin/Globulin Ratio: 1.8 (ref 1.2–2.2)
Albumin: 4.6 g/dL (ref 3.9–5.0)
Alkaline Phosphatase: 50 IU/L (ref 44–121)
BUN/Creatinine Ratio: 11 (ref 9–23)
BUN: 7 mg/dL (ref 6–20)
Bilirubin Total: 0.2 mg/dL (ref 0.0–1.2)
CO2: 21 mmol/L (ref 20–29)
Calcium: 9.6 mg/dL (ref 8.7–10.2)
Chloride: 102 mmol/L (ref 96–106)
Creatinine, Ser: 0.66 mg/dL (ref 0.57–1.00)
GFR calc Af Amer: 144 mL/min/{1.73_m2} (ref >59)
GFR calc non Af Amer: 125 mL/min/{1.73_m2} (ref >59)
Globulin, Total: 2.6 g/dL (ref 1.5–4.5)
Glucose: 123 mg/dL — ABNORMAL HIGH (ref 65–99)
Potassium: 4 mmol/L (ref 3.5–5.2)
Sodium: 139 mmol/L (ref 134–144)
Total Protein: 7.2 g/dL (ref 6.0–8.5)

## 2020-03-23 LAB — CBC/D/PLT+RPR+RH+ABO+AB SCR
Antibody Screen: NEGATIVE
Basophils Absolute: 0 10*3/uL (ref 0.0–0.2)
Basos: 0 %
EOS (ABSOLUTE): 0 10*3/uL (ref 0.0–0.4)
Eos: 0 %
Hematocrit: 38.6 % (ref 34.0–46.6)
Hemoglobin: 13.2 g/dL (ref 11.1–15.9)
Hepatitis B Surface Ag: NEGATIVE
Immature Grans (Abs): 0 10*3/uL (ref 0.0–0.1)
Immature Granulocytes: 0 %
Lymphocytes Absolute: 2.8 10*3/uL (ref 0.7–3.1)
Lymphs: 30 %
MCH: 29.3 pg (ref 26.6–33.0)
MCHC: 34.2 g/dL (ref 31.5–35.7)
MCV: 86 fL (ref 79–97)
Monocytes Absolute: 0.4 10*3/uL (ref 0.1–0.9)
Monocytes: 4 %
Neutrophils Absolute: 6.1 10*3/uL (ref 1.4–7.0)
Neutrophils: 66 %
Platelets: 379 10*3/uL (ref 150–450)
RBC: 4.5 x10E6/uL (ref 3.77–5.28)
RDW: 12.3 % (ref 11.7–15.4)
RPR Ser Ql: NONREACTIVE
Rh Factor: POSITIVE
WBC: 9.5 10*3/uL (ref 3.4–10.8)

## 2020-03-23 LAB — HGB FRACTIONATION CASCADE
Hgb A2: 2.7 % (ref 1.8–3.2)
Hgb A: 97.3 % (ref 96.4–98.8)
Hgb F: 0 % (ref 0.0–2.0)
Hgb S: 0 %

## 2020-03-23 LAB — PROTEIN / CREATININE RATIO, URINE
Creatinine, Urine: 210.1 mg/dL
Protein, Ur: 13 mg/dL
Protein/Creat Ratio: 62 mg/g creat (ref 0–200)

## 2020-03-23 LAB — HGB A1C W/O EAG: Hgb A1c MFr Bld: 5.4 % (ref 4.8–5.6)

## 2020-03-23 LAB — HCV INTERPRETATION

## 2020-03-23 LAB — HCV AB W/RFLX TO VERIFICATION: HCV Ab: <0.1 s/co ratio (ref 0.0–0.9)

## 2020-03-23 LAB — TSH: TSH: 2.15 u[IU]/mL (ref 0.450–4.500)

## 2020-03-23 LAB — GLUCOSE, 1 HOUR GESTATIONAL: Gestational Diabetes Screen: 114 mg/dL (ref 65–139)

## 2020-03-23 LAB — HIV ANTIBODY (ROUTINE TESTING W REFLEX): HIV Screen 4th Generation wRfx: NONREACTIVE

## 2020-03-23 NOTE — Telephone Encounter (Signed)
Call to client regarding her text. Counseled that since UA was obtained following her speculum vaginal exam, most likely blood in urine due to irritation from exam. Client denies bleeding / spotting / cramping. Jossie Ng, RN

## 2020-03-24 LAB — URINE CULTURE

## 2020-03-24 LAB — CHLAMYDIA/GC NAA, CONFIRMATION
Chlamydia trachomatis, NAA: NEGATIVE
Neisseria gonorrhoeae, NAA: NEGATIVE

## 2020-03-24 LAB — PAP IG (IMAGE GUIDED): PAP Smear Comment: 0

## 2020-03-31 LAB — 789231 7+OXYCODONE-BUND
Amphetamines, Urine: NEGATIVE ng/mL
BENZODIAZ UR QL: NEGATIVE ng/mL
Barbiturate screen, urine: NEGATIVE ng/mL
Cocaine (Metab.): NEGATIVE ng/mL
OPIATE SCREEN URINE: NEGATIVE ng/mL
Oxycodone/Oxymorphone, Urine: NEGATIVE ng/mL
PCP Quant, Ur: NEGATIVE ng/mL

## 2020-03-31 LAB — CANNABINOID CONFIRMATION, UR
CANNABINOIDS: POSITIVE — AB
Carboxy THC GC/MS Conf: 221 ng/mL

## 2020-04-01 ENCOUNTER — Encounter: Payer: Self-pay | Admitting: Advanced Practice Midwife

## 2020-04-01 ENCOUNTER — Other Ambulatory Visit: Payer: Self-pay

## 2020-04-01 ENCOUNTER — Ambulatory Visit: Payer: Medicaid Other | Attending: Obstetrics and Gynecology

## 2020-04-01 ENCOUNTER — Ambulatory Visit (HOSPITAL_BASED_OUTPATIENT_CLINIC_OR_DEPARTMENT_OTHER): Payer: Medicaid Other

## 2020-04-01 DIAGNOSIS — Z369 Encounter for antenatal screening, unspecified: Secondary | ICD-10-CM | POA: Diagnosis not present

## 2020-04-01 DIAGNOSIS — Z3A10 10 weeks gestation of pregnancy: Secondary | ICD-10-CM | POA: Diagnosis not present

## 2020-04-01 DIAGNOSIS — Z8279 Family history of other congenital malformations, deformations and chromosomal abnormalities: Secondary | ICD-10-CM

## 2020-04-01 DIAGNOSIS — O99211 Obesity complicating pregnancy, first trimester: Secondary | ICD-10-CM | POA: Diagnosis present

## 2020-04-01 DIAGNOSIS — E669 Obesity, unspecified: Secondary | ICD-10-CM | POA: Insufficient documentation

## 2020-04-01 NOTE — Progress Notes (Signed)
Referring Provider: [facility] Department Length of Consultation: 40 minutes  Krista Huffman  was referred to [facility]  for genetic counseling to review prenatal screening and testing options.  This note summarizes the information we discussed.    We offered the following routine screening tests for this pregnancy:  The most accurate screening option for chromosome conditions is cell free fetal DNA testing.  Though this is typically reserved for pregnancies at increased risk for aneuploidy, it is currently being made available and many insurance companies are adding coverage for this testing in low risk patients during [place].  This test utilizes a maternal blood sample and DNA sequencing technology to isolate circulating cell free fetal DNA from maternal plasma.  The fetal DNA can then be analyzed for DNA sequences that are derived from the three most common chromosomes involved in aneuploidy, chromosomes 13, 18, and 21.  If the overall amount of DNA is greater than the expected level for any of these chromosomes, aneuploidy is suspected.  The detection rates are >99% for Down syndrome, >98% for trisomy 18 and >91% for Trisomy 13.  While we do not consider it a replacement for invasive testing and karyotype analysis, a negative result from this testing would be reassuring, though not a guarantee of a normal chromosome complement for the baby.  An abnormal result may be suggestive of an abnormal chromosome complement, though we would still recommend CVS or amniocentesis to confirm any findings from this testing.  First trimester screening, which includes nuchal translucency ultrasound screen and first trimester maternal serum marker screening, is the test that has most recently been available for low risk patients.  The nuchal translucency has approximately an 80% detection rate for Down syndrome and can be positive for other chromosome abnormalities as well as congenital  heart defects.  When combined with a maternal serum marker screening, the detection rate is up to 90% for Down syndrome and up to 97% for trisomy 18.   Given current recommendations during COVID, we are offering only the biochemical testing portion of this testing (without the ultrasound and NT portion), which has a much lower detection rate.  Maternal serum marker screening, or "quad" screen, is a blood test that measures pregnancy proteins, can provide risk assessments for Down syndrome, trisomy 18, and open neural tube defects (spina bifida, anencephaly). Because it does not directly examine the fetus, it cannot positively diagnose or rule out these problems. This is a second trimester option which could be offered along with the anatomy ultrasound. It can detect approximately 75% of babies with Down syndrome, 80% of babies with open spina bifida and 70% of babies with trisomy 63.  Targeted ultrasound uses high frequency sound waves to create an image of the developing fetus.  An ultrasound is often recommended as a routine means of evaluating the pregnancy.  It is also used to screen for fetal anatomy problems (for example, a heart defect) that might be suggestive of a chromosomal or other abnormality. We are currently not recommending a first trimester ultrasound other than that which would be ordered for dating and viability.  Should these screening tests indicate an increased concern, then the following additional testing options would be offered:  The chorionic villus sampling procedure is available for first trimester chromosome analysis.  This involves the withdrawal of a small amount of chorionic villi (tissue from the developing placenta).  Risk of pregnancy loss is estimated to be approximately 1 in 200 to 1  in 100 (0.5 to 1%).  There is approximately a 1% (1 in 100) chance that the CVS chromosome results will be unclear.  Chorionic villi cannot be tested for neural tube defects.      Amniocentesis involves the removal of a small amount of amniotic fluid from the sac surrounding the fetus with the use of a thin needle inserted through the maternal abdomen and uterus.  Ultrasound guidance is used throughout the procedure.  Fetal cells from amniotic fluid are directly evaluated and > 99.5% of chromosome problems and > 98% of open neural tube defects can be detected. This procedure is generally performed after the 15th week of pregnancy.  The main risks to this procedure include complications leading to miscarriage in less than 1 in 200 cases (0.5%).  Cystic Fibrosis and Spinal Muscular Atrophy (SMA) screening were also discussed with the patient. Both conditions are recessive, which means that both parents must be carriers in order to have a child with the disease.  Cystic fibrosis (CF) is one of the most common genetic conditions in persons of Caucasian ancestry.  This condition occurs in approximately 1 in 2,500 Caucasian persons and results in thickened secretions in the lungs, digestive, and reproductive systems.  For a baby to be at risk for having CF, both of the parents must be carriers for this condition.  Approximately 1 in 53 Caucasian persons is a carrier for CF.  Current carrier testing looks for the most common mutations in the gene for CF and can detect approximately 90% of carriers in the Caucasian population.  This means that the carrier screening can greatly reduce, but cannot eliminate, the chance for an individual to have a child with CF.  If an individual is found to be a carrier for CF, then carrier testing would be available for the partner. As part of [place] newborn screening profile, all babies born in the state of [place] will have a two-tier screening process.  Specimens are first tested to determine the concentration of immunoreactive trypsinogen (IRT).  The top 5% of specimens with the highest IRT values then undergo DNA testing using a panel of  over 40 common CF mutations. SMA is a neurodegenerative disorder that leads to atrophy of skeletal muscle and overall weakness.  This condition is also more prevalent in the Caucasian population, with 1 in 40-1 in 60 persons being a carrier and 1 in 6,000-1 in 10,000 children being affected.  There are multiple forms of the disease, with some causing death in infancy to other forms with survival into adulthood.  The genetics of SMA is complex, but carrier screening can detect up to 95% of carriers in the Caucasian population.  Similar to CF, a negative result can greatly reduce, but cannot eliminate, the chance to have a child with SMA. Beginning in 2021, all babies born in [place] are tested for SMA as part of newborn screening.  We obtained a detailed family history and pregnancy history. Krista Huffman stated that this is her first pregnancy.  She reported no complications in the pregnancy, but did indicate that she has been feeling depressed.  We encouraged her to speak with her OB for a referral to a counselor.  She has met with someone in the past and is on board with this again, though she prefers not to take medication for depression.  We also talked about the increased concern for post partum depression in women with a prior history of mental health concerns. She also  reported smoking marijuana early in the pregnancy.  We reviewed the importance of avoiding this during pregnancy, as smoking marijuana has been associated with low birth weight, preterm delivery and poor pregnancy outcomes.    In the family history, Krista Huffman has a maternal second cousin with Down syndrome.  Down syndrome is caused by an extra copy of the genetic instructions located on chromosome number 21.  These extra instructions result in the characteristic facial appearance, intellectual disabilities and an increased risk for some types of birth defects.  The majority (95%) of persons with Down syndrome have three freestanding copies of  chromosome number 21, called trisomy 1.  This type of Down syndrome occurs as a sporadic condition and does not increase the chance for other family members to have Down syndrome.  Rarely, Down syndrome is caused by a rearrangement of the genetic instructions, where the extra chromosome number 21 is attached to another chromosome.  This type of chromosome rearrangement can be passed down through families and therefore may increase the chance for Down syndrome in other family members.  We cannot determine which type of Down syndrome is present without documentation of chromosome studies in the affected family member.  If any additional information is obtained about this history, please do not hesitate to contact us.  Either type of Down syndrome can be detected with the screening tests offered above.  She also has a maternal uncle who had a stillborn baby followed by 3 health sons and a maternal first cousin once removed who had 5 miscarriages before having two healthy children. There may be many reasons for miscarriages as well as stillbirth.  Without additional information about the reason for these losses, it is difficult to determine the level of concern for other family members. The remainder of the family history was reported to be unremarkable for birth defects, intellectual delays, recurrent pregnancy loss or known chromosome abnormalities. Prior hemoglobinopathy screening was performed at [facility] and was normal (AA, MCV 86).  Krista Huffman had an ultrasound at the time of this visit and was found to be 10 weeks, 6 days.  The detailed fetal anatomy could not be assessed due to the early gestational age.  She was scheduled to return to clinic in 8 weeks for a detailed anatomy ultrasound.  After consideration of the options, Krista Huffman elected to have [place] with SCA as well as carrier screening for CF and SMA.  Given the gestational age and the maternal BMI, the patient will return to our clinic to  have these drawn in 2 weeks.    The patient was encouraged to call with questions or concerns.  We can be contacted at [phone].  Labs ordered: none today - to return in 2 weeks  Wilburt Finlay, MS, CGC

## 2020-04-09 ENCOUNTER — Encounter: Payer: Self-pay | Admitting: Family Medicine

## 2020-04-15 ENCOUNTER — Other Ambulatory Visit: Payer: Self-pay

## 2020-04-15 ENCOUNTER — Ambulatory Visit: Payer: Medicaid Other | Attending: Obstetrics

## 2020-04-15 VITALS — Wt 246.0 lb

## 2020-04-15 DIAGNOSIS — Z8279 Family history of other congenital malformations, deformations and chromosomal abnormalities: Secondary | ICD-10-CM | POA: Insufficient documentation

## 2020-04-15 DIAGNOSIS — Z36 Encounter for antenatal screening for chromosomal anomalies: Secondary | ICD-10-CM | POA: Diagnosis present

## 2020-04-15 NOTE — Progress Notes (Unsigned)
Pt in today for lab only visit.  Blood drawn for MaterniT21 Plus with SCA, Cystic fibrosis carrier screening and SMA carrier screening.  I will contact patient with the results when they become available, typically in 1-2 weeks.  Cherly Anderson, MS, CGC

## 2020-04-19 ENCOUNTER — Ambulatory Visit: Payer: Medicaid Other | Admitting: Family Medicine

## 2020-04-19 ENCOUNTER — Other Ambulatory Visit: Payer: Self-pay

## 2020-04-19 VITALS — BP 117/75 | HR 82 | Temp 98.9°F | Ht 69.0 in | Wt 239.2 lb

## 2020-04-19 DIAGNOSIS — O99210 Obesity complicating pregnancy, unspecified trimester: Secondary | ICD-10-CM

## 2020-04-19 DIAGNOSIS — O0990 Supervision of high risk pregnancy, unspecified, unspecified trimester: Secondary | ICD-10-CM

## 2020-04-19 MED ORDER — ASPIRIN EC 81 MG PO TBEC: 81.0000 mg | DELAYED_RELEASE_TABLET | Freq: Every day | ORAL | 2 refills | Status: DC

## 2020-04-19 NOTE — Progress Notes (Signed)
° °  PRENATAL VISIT NOTE  Subjective:  Krista Huffman is a 24 y.o. G1P0000 at [redacted]w[redacted]d being seen today for ongoing prenatal care.  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  Patient reports dizzy and lightheaded yesterday. .  Contractions: Not present. Vag. Bleeding: None.  Movement: Absent.Denies leaking of fluid/ROM.   The following portions of the patient's history were reviewed and updated as appropriate: allergies, current medications, past family history, past medical history, past social history, past surgical history and problem list. Problem list updated.  Objective:   Vitals:   04/19/20 0851 04/19/20 0920  BP: 117/75   Pulse: 82   Temp: 98.9 F (37.2 C)   Weight: 239 lb 3.2 oz (108.5 kg)   Height:  5\' 9"  (1.753 m)    Fetal Status: Fetal Heart Rate (bpm): 150 Fundal Height: 13 cm Movement: Absent     General:  Alert, oriented and cooperative. Patient is in no acute distress.  Skin: Skin is warm and dry. No rash noted.   Cardiovascular: Normal heart rate noted  Respiratory: Normal respiratory effort, no problems with respiration noted  Abdomen: Soft, gravid, appropriate for gestational age.  Pain/Pressure: Absent     Pelvic: Cervical exam deferred        Extremities: Normal range of motion.     Mental Status: Normal mood and affect. Normal behavior. Normal judgment and thought content.   Assessment and Plan:  Pregnancy: G1P0000 at [redacted]w[redacted]d  1. Supervision of high risk pregnancy, antepartum - Dizziness was position in nature, recommended increased water intake - Letter for work provided - FHT heard today - aspirin EC 81 MG tablet; Take 1 tablet (81 mg total) by mouth daily. Take after 12 weeks for prevention of preeclampsia later in pregnancy  Dispense: 300  tablet; Refill: 2  2. Obesity affecting pregnancy, antepartum TWG=9 lb 3.2 oz (4.173 kg) which is above goal for 1st trimester. Total weight gain of 11-15 lb discussed with patient today -Discussed starting ASA and patient agrees. Handout given - Referral to nutrition placed today - aspirin EC 81 MG tablet; Take 1 tablet (81 mg total) by mouth daily. Take after 12 weeks for prevention of preeclampsia later in pregnancy  Dispense: 300 tablet; Refill: 2    Preterm labor symptoms and general obstetric precautions including but not limited to vaginal bleeding, contractions, leaking of fluid and fetal movement were reviewed in detail with the patient. Please refer to After Visit Summary for other counseling recommendations.   Return in about 4 weeks (around 05/17/2020) for Routine prenatal care.  Future Appointments  Date Time Provider Department Center  05/17/2020  1:20 PM [facility] PROVIDER [facility] None  05/27/2020 10:00 AM [facility]    05/29/2020, MD

## 2020-04-19 NOTE — Progress Notes (Signed)
Presents for MH RV at 13.3 weeks. Take PNV, Denies ED/Hospital visit since last RV. Sharlyne Pacas, RN

## 2020-04-20 LAB — MATERNIT21 PLUS CORE+SCA
Fetal Fraction: 3
Monosomy X (Turner Syndrome): NOT DETECTED
Result (T21): NEGATIVE
Trisomy 13 (Patau syndrome): NEGATIVE
Trisomy 18 (Edwards syndrome): NEGATIVE
Trisomy 21 (Down syndrome): NEGATIVE
XXX (Triple X Syndrome): NOT DETECTED
XXY (Klinefelter Syndrome): NOT DETECTED
XYY (Jacobs Syndrome): NOT DETECTED

## 2020-04-21 LAB — CYSTIC FIBROSIS GENE TEST

## 2020-04-26 ENCOUNTER — Telehealth: Payer: Self-pay | Admitting: Obstetrics and Gynecology

## 2020-04-26 LAB — SMN1 COPY NUMBER ANALYSIS (SMA CARRIER SCREENING)

## 2020-04-26 NOTE — Telephone Encounter (Signed)
The patient was informed of the results of her recent MaterniT21 testing which yielded NEGATIVE results.  The patient's specimen showed DNA consistent with two copies of chromosomes 21, 18 and 13.  The sensitivity for trisomy 51, trisomy 42 and trisomy 42 using this testing are reported as 99.1%, 99.9% and 91.7% respectively.  Thus, while the results of this testing are highly accurate, they are not considered diagnostic at this time.  Should more definitive information be desired, the patient may still consider amniocentesis.   As requested to know by the patient, sex chromosome analysis was included for this sample.  Results are consistent with a female fetus. This is predicted with >99% accuracy.  A maternal serum AFP only should be considered if screening for neural tube defects is desired.  We also informed Ms. Stemmler of the results Cystic fibrosis (CF) carrier screening. CF is a genetic condition that occurs most often in Caucasian persons.  It primarily affects the lungs, digestive, and reproductive systems.  For someone to be at risk for having CF, both of their parents must be carriers for CF.  The testing can detect many persons who are carriers for CF and therefore determine if the pregnancy is at an increased risk for this condition.    The blood test results were negative when examined for the 32 most common mutations (or changes) in the gene for CF.  This means that she does not carry any of the most common changes in this gene.  Testing for these 32 mutations detects approximately 73% of carriers who are Hispanic and 90% of carriers of Caucasian background.  Therefore, the chance that she is a carrier based on this negative result has been reduced from 1 in 46 to approximately 1 in 168.  Because this testing cannot detect all changes that may cause CF, we cannot eliminate the chance that this individual is a carrier completely.  SMA carrier screening results are still pending.  We may be  reached at [phone] with any questions or concerns.  Cherly Anderson, MS, CGC

## 2020-04-26 NOTE — Telephone Encounter (Signed)
Left message for [name] to call back to review lab results.  Krista Anderson, MS, CGC

## 2020-04-29 ENCOUNTER — Telehealth: Payer: Self-pay | Admitting: Obstetrics and Gynecology

## 2020-04-29 NOTE — Telephone Encounter (Signed)
The results of the SMA carrier screening are now available and were communicated to Krista Huffman by phone.  SMA is also a recessive genetic condition with variable age of onset and severity caused by mutations in the SMN1 gene.  This carrier testing assesses the number of copies of this gene.  Persons with one copy of the SMN1 gene are carriers, and those with no copies are affected with the condition.  Individuals with two or more copies have a reduced chance to be a carrier.  Not all mutations can be detected with this testing, though it can detect 92.6% of carriers in the Hispanic population.  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 *3+80T>G SNP, thus reducing her chance to be a carrier from 1 in 42 to 1 in 906.  Again, this testing cannot eliminate the chance to have a child with SMA, but dramatically reduces the chance.    We encouraged the patient to call with any questions or concerns as they arise.  We may be reached at [phone].  Cherly Anderson, MS, CGC

## 2020-05-17 ENCOUNTER — Ambulatory Visit: Payer: Medicaid Other | Admitting: Family Medicine

## 2020-05-17 ENCOUNTER — Other Ambulatory Visit: Payer: Self-pay

## 2020-05-17 VITALS — BP 116/69 | HR 78 | Temp 99.6°F | Wt 254.4 lb

## 2020-05-17 DIAGNOSIS — F1290 Cannabis use, unspecified, uncomplicated: Secondary | ICD-10-CM

## 2020-05-17 DIAGNOSIS — O99210 Obesity complicating pregnancy, unspecified trimester: Secondary | ICD-10-CM

## 2020-05-17 DIAGNOSIS — G43809 Other migraine, not intractable, without status migrainosus: Secondary | ICD-10-CM

## 2020-05-17 DIAGNOSIS — Z550 Illiteracy and low-level literacy: Secondary | ICD-10-CM

## 2020-05-17 DIAGNOSIS — O0990 Supervision of high risk pregnancy, unspecified, unspecified trimester: Secondary | ICD-10-CM

## 2020-05-17 MED ORDER — CYCLOBENZAPRINE HCL 10 MG PO TABS: 10.0000 mg | ORAL_TABLET | Freq: Three times a day (TID) | ORAL | 0 refills | Status: DC | PRN

## 2020-05-17 MED ORDER — ONDANSETRON 4 MG PO TBDP: 4.0000 mg | ORAL_TABLET | Freq: Four times a day (QID) | ORAL | 0 refills | Status: DC | PRN

## 2020-05-17 NOTE — Progress Notes (Signed)
   PRENATAL VISIT NOTE  Subjective:  Krista Huffman is a 24 y.o. G1P0000 at [redacted]w[redacted]d being seen today for ongoing prenatal care.  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  Patient reports no complaints.  Contractions: Not present. Vag. Bleeding: None.  Movement: Absent. Denies leaking of fluid/ROM.   The following portions of the patient's history were reviewed and updated as appropriate: allergies, current medications, past family history, past medical history, past social history, past surgical history and problem list. Problem list updated.  Objective:   Vitals:   05/17/20 1319  BP: 116/69  Pulse: 78  Temp: 99.6 F (37.6 C)  Weight: 254 lb 6.4 oz (115.4 kg)    Fetal Status: Fetal Heart Rate (bpm): 145 Fundal Height: 17 cm Movement: Absent     General:  Alert, oriented and cooperative. Patient is in no acute distress.  Skin: Skin is warm and dry. No rash noted.   Cardiovascular: Normal heart rate noted  Respiratory: Normal respiratory effort, no problems with respiration noted  Abdomen: Soft, gravid, appropriate for gestational age.  Pain/Pressure: Absent     Pelvic: Cervical exam deferred        Extremities: Normal range of motion.     Mental Status: Normal mood and affect. Normal behavior. Normal judgment and thought content.   Assessment and Plan:  Pregnancy: G1P0000 at [redacted]w[redacted]d  1. Obesity affecting pregnancy, antepartum TWG=24 lb 6.4 oz (11.1 kg)  Which is well above goal which is 11-15lb. Counseled to focus on lean proteins and increase movement.  Was seen by nutrition in early pregnancy. Talmadge Chad 3rd shift which makes eating healthy a challenge per patient.  Taking ASA  2. Supervision of high risk pregnancy, antepartum Having minor ligament pain. Counseled  on normality at this GA Review results from carrier and NIPT screening in Epic  AFP only today Has [place] scheduled for 12/30  3. +UDS MJ 03/22/20  4. 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 Migraines - having 1 per week -- better with tylenol and rest. Associated with vomiting - rx for flexeril and zofran provided    Preterm labor symptoms and general obstetric precautions including but not limited to vaginal bleeding, contractions, leaking of fluid and fetal movement were reviewed in detail with the patient. Please refer to After Visit Summary for other counseling recommendations.  Return in about 4 weeks (around 06/14/2020) for Routine prenatal care.  Future Appointments  Date Time Provider Department Center  05/27/2020 10:00 AM [facility]    Federico Flake, MD

## 2020-05-17 NOTE — Progress Notes (Signed)
Aware of 05/27/20 [place] appt. Encouraged client to take PNV and Aspirin daily. Client desires AFP only today. Jossie Ng, RN

## 2020-05-24 ENCOUNTER — Other Ambulatory Visit: Payer: Self-pay | Admitting: Advanced Practice Midwife

## 2020-05-24 DIAGNOSIS — O99212 Obesity complicating pregnancy, second trimester: Secondary | ICD-10-CM

## 2020-05-27 ENCOUNTER — Ambulatory Visit: Payer: Medicaid Other | Attending: Maternal & Fetal Medicine

## 2020-05-27 ENCOUNTER — Other Ambulatory Visit: Payer: Self-pay

## 2020-05-27 DIAGNOSIS — O99210 Obesity complicating pregnancy, unspecified trimester: Secondary | ICD-10-CM

## 2020-05-27 DIAGNOSIS — E669 Obesity, unspecified: Secondary | ICD-10-CM | POA: Diagnosis not present

## 2020-05-27 DIAGNOSIS — O0990 Supervision of high risk pregnancy, unspecified, unspecified trimester: Secondary | ICD-10-CM

## 2020-05-27 DIAGNOSIS — Z3A18 18 weeks gestation of pregnancy: Secondary | ICD-10-CM

## 2020-05-27 DIAGNOSIS — O99212 Obesity complicating pregnancy, second trimester: Secondary | ICD-10-CM | POA: Diagnosis not present

## 2020-05-31 ENCOUNTER — Encounter: Payer: Self-pay | Admitting: Advanced Practice Midwife

## 2020-06-04 ENCOUNTER — Telehealth: Payer: Self-pay | Admitting: Family Medicine

## 2020-06-04 NOTE — Telephone Encounter (Signed)
Patient states her mom tested positive for Covid and now she is having symptoms that she has an appt for later today for testing but meanwhile she would like to know what can she take for body ache, she said that she has taken tylenol but is not helping. Please call back.

## 2020-06-04 NOTE — Telephone Encounter (Signed)
TC to patient who is complaining of bodyaches and asking for advice. Patient states her mom is covid positive, and patient has a covid test scheduled for this afternoon. Patient states she is taking extra strengthTylenol and it's not helping with her body aches very much. Patient counseled to take Tylenol as needed but no more than 2 extra strength Tylenol every 6 hours. Patient also counseled to drink plenty of fluids, rest as much as possible, take a warm bath, use hot water bottle, and pillows placed to increase comfort. Patient counseled to isolate herself from others in the house and to continue until test results received. Patient states understanding and denies further questions.Burt Knack, RN

## 2020-06-08 ENCOUNTER — Telehealth: Payer: Self-pay | Admitting: Family Medicine

## 2020-06-08 ENCOUNTER — Telehealth: Payer: Self-pay

## 2020-06-08 NOTE — Telephone Encounter (Signed)
Patient called to say she was told by the covid vaccine clinic that she needs to isolate for 5 days following onset of symptoms. States her symptoms began on Wed, 06/02/20, and she was tested on 06/04/20 and received her positive results today. RN from clinic, L. Broadus John, called covid vaccine clinic and was told the same information. Patient counseled to keep her 06/15/2020 appointment.Burt Knack, RN

## 2020-06-08 NOTE — Telephone Encounter (Signed)
Patient called to let us know she tested +Covid. And she wants to know if she should keep her appt for the 18th here with [place], or if she needs to cancel and reschedule.  Please call her back.

## 2020-06-08 NOTE — Telephone Encounter (Signed)
TC to patient who states she is positive for covid. Patient given [facility] covid line phone number and counseled to call for the most up to date information about isolation and quarantine for patients with a positive covid test. Patient states she will call the covid line and then call scheduling to change her next appointment (06/15/20) if needed.Marland KitchenMarland KitchenBurt Knack, RN

## 2020-06-15 ENCOUNTER — Other Ambulatory Visit: Payer: Self-pay

## 2020-06-15 ENCOUNTER — Ambulatory Visit: Payer: Medicaid Other | Admitting: Advanced Practice Midwife

## 2020-06-15 VITALS — BP 121/73 | HR 98 | Temp 98.9°F | Wt 251.0 lb

## 2020-06-15 DIAGNOSIS — F1290 Cannabis use, unspecified, uncomplicated: Secondary | ICD-10-CM

## 2020-06-15 DIAGNOSIS — O0990 Supervision of high risk pregnancy, unspecified, unspecified trimester: Secondary | ICD-10-CM

## 2020-06-15 DIAGNOSIS — O99210 Obesity complicating pregnancy, unspecified trimester: Secondary | ICD-10-CM

## 2020-06-15 DIAGNOSIS — U071 COVID-19: Secondary | ICD-10-CM | POA: Insufficient documentation

## 2020-06-15 LAB — URINALYSIS
Bilirubin, UA: NEGATIVE
Glucose, UA: NEGATIVE
Leukocytes,UA: NEGATIVE
Nitrite, UA: NEGATIVE
Specific Gravity, UA: 1.025 (ref 1.005–1.030)
Urobilinogen, Ur: 1 mg/dL (ref 0.2–1.0)
pH, UA: 6.5 (ref 5.0–7.5)

## 2020-06-15 NOTE — Progress Notes (Signed)
   PRENATAL VISIT NOTE  Subjective:  Krista Huffman is a 25 y.o. G1P0000 at [redacted]w[redacted]d being seen today for ongoing prenatal care.  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  Patient reports no complaints.  Contractions: Not present. Vag. Bleeding: None.  Movement: Absent. Denies leaking of fluid/ROM.   The following portions of the patient's history were reviewed and updated as appropriate: allergies, current medications, past family history, past medical history, past social history, past surgical history and problem list. Problem list updated.  Objective:   Vitals:   06/15/20 1329  BP: 121/73  Pulse: 98  Temp: 98.9 F (37.2 C)  Weight: 251 lb (113.9 kg)    Fetal Status: Fetal Heart Rate (bpm): 150 Fundal Height: 23 cm Movement: Absent     General:  Alert, oriented and cooperative. Patient is in no acute distress.  Skin: Skin is warm and dry. No rash noted.   Cardiovascular: Normal heart rate noted  Respiratory: Normal respiratory effort, no problems with respiration noted  Abdomen: Soft, gravid, appropriate for gestational age.  Pain/Pressure: Absent     Pelvic: Cervical exam deferred        Extremities: Normal range of motion.  Edema: None  Mental Status: Normal mood and affect. Normal behavior. Normal judgment and thought content.   Assessment and Plan:  Pregnancy: G1P0000 at [redacted]w[redacted]d  1. Obesity affecting pregnancy, antepartum 21 lb (9.526 kg) Taking ASA 81 mg daily Not excercising--encouraged to do so 4x/wk x 20 min  2. Supervision of high risk pregnancy, antepartum Feels well.  Working at [employer] 30 hr/wk.  1 hour glucola=114 on 03/22/20.  AFP only neg on 05/17/20.   Reviewed 05/27/20 u/s with EFW  49%, AFI wnl, normal anatomy but suboptimal views due to fetal position at 19 1/7.  F/U u/s scheduled for 06/24/20. Living with female m. Cousin.  Last vomited this AM and time before was 2 wks ago. Ketones 1+ and last ate at 0930 (2:14 now). +Covid 06/04/20. Pt states h/a resolving (usually left eye or bilateral, -audio, -visual, +N&V, -neuro) Not with FOB and he is not supportive. Pt has not picked up 2 rx for Flexeril or Zofran yet that was prescribed 05/27/20--encouraged to do so today. - Urinalysis (Urine Dip)  3. Marijuana use (+UDS 03/22/20 MJ) States last use 03/22/20--counseled not to use Agrees to UDS today - 449675 Drug Screen  4. COVID-19 virus infection 06/04/20 Recovered   Preterm labor symptoms and general obstetric precautions including but not limited to vaginal bleeding, contractions, leaking of fluid and fetal movement were reviewed in detail with the patient. Please refer to After Visit Summary for other counseling recommendations.  No follow-ups on file.  Future Appointments  Date Time Provider Department Center  06/24/2020 11:00 AM [facility]    Alberteen Spindle, CNM

## 2020-06-15 NOTE — Progress Notes (Signed)
Patient here for MH RV at 21 4/7. Aware of [facility] U/S on 06/24/2020 at 11:00am. States she hasn't had a migraine since medication prescribed and has not picked up her medication. EDPS etc. Forms today. Still unsure of pediatrician.Burt Knack, RN

## 2020-06-15 NOTE — Progress Notes (Signed)
Urine dip reviewed by E. Sciora CNM. Per client, ate breakfast of S[employer], hash brown cake and sweet tea ~ 0940 this am. Per Ms. Sciora, client to increase fluids (water, milk, juice) and have a protein lunch. Suggestions for protein provided. Client verbalized understanding of above. Jossie Ng, RN

## 2020-06-22 ENCOUNTER — Other Ambulatory Visit: Payer: Self-pay | Admitting: Advanced Practice Midwife

## 2020-06-22 DIAGNOSIS — E669 Obesity, unspecified: Secondary | ICD-10-CM

## 2020-06-24 ENCOUNTER — Other Ambulatory Visit: Payer: Self-pay

## 2020-06-24 ENCOUNTER — Ambulatory Visit: Payer: Medicaid Other | Attending: Obstetrics

## 2020-06-24 DIAGNOSIS — O0990 Supervision of high risk pregnancy, unspecified, unspecified trimester: Secondary | ICD-10-CM

## 2020-06-24 DIAGNOSIS — O99210 Obesity complicating pregnancy, unspecified trimester: Secondary | ICD-10-CM | POA: Diagnosis not present

## 2020-06-24 DIAGNOSIS — Z3A22 22 weeks gestation of pregnancy: Secondary | ICD-10-CM

## 2020-06-24 DIAGNOSIS — E669 Obesity, unspecified: Secondary | ICD-10-CM | POA: Diagnosis not present

## 2020-06-24 DIAGNOSIS — O99212 Obesity complicating pregnancy, second trimester: Secondary | ICD-10-CM | POA: Diagnosis present

## 2020-06-25 ENCOUNTER — Encounter: Payer: Self-pay | Admitting: Advanced Practice Midwife

## 2020-07-01 LAB — CANNABINOID CONFIRMATION, UR
CANNABINOIDS: POSITIVE — AB
Carboxy THC GC/MS Conf: 529 ng/mL

## 2020-07-01 LAB — 789231 7+OXYCODONE-BUND
Amphetamines, Urine: NEGATIVE ng/mL
BENZODIAZ UR QL: NEGATIVE ng/mL
Barbiturate screen, urine: NEGATIVE ng/mL
Cocaine (Metab.): NEGATIVE ng/mL
OPIATE SCREEN URINE: NEGATIVE ng/mL
Oxycodone/Oxymorphone, Urine: NEGATIVE ng/mL
PCP Quant, Ur: NEGATIVE ng/mL

## 2020-07-13 ENCOUNTER — Telehealth: Payer: Self-pay

## 2020-07-13 ENCOUNTER — Ambulatory Visit: Payer: Medicaid Other

## 2020-07-13 NOTE — Telephone Encounter (Signed)
TC to patient to reschedule today's appointment due to no provider available. Patient is rescheduled for 07/15/2020.Marland KitchenBurt Knack, RN

## 2020-07-15 ENCOUNTER — Ambulatory Visit: Payer: Medicaid Other

## 2020-07-19 ENCOUNTER — Ambulatory Visit: Payer: Medicaid Other

## 2020-07-21 ENCOUNTER — Other Ambulatory Visit: Payer: Self-pay

## 2020-07-21 ENCOUNTER — Ambulatory Visit: Payer: Medicaid Other | Admitting: Advanced Practice Midwife

## 2020-07-21 VITALS — BP 118/67 | HR 96 | Temp 99.4°F | Wt 257.2 lb

## 2020-07-21 DIAGNOSIS — O0990 Supervision of high risk pregnancy, unspecified, unspecified trimester: Secondary | ICD-10-CM

## 2020-07-21 DIAGNOSIS — O99210 Obesity complicating pregnancy, unspecified trimester: Secondary | ICD-10-CM

## 2020-07-21 DIAGNOSIS — Z550 Illiteracy and low-level literacy: Secondary | ICD-10-CM

## 2020-07-21 DIAGNOSIS — F1290 Cannabis use, unspecified, uncomplicated: Secondary | ICD-10-CM

## 2020-07-21 LAB — URINALYSIS, MICROSCOPIC ONLY
Casts: NONE SEEN /lpf
Mucus, UA: NONE SEEN
Renal Epithel, UA: NONE SEEN /hpf
Trichomonas, UA: NONE SEEN
Yeast, UA: NONE SEEN

## 2020-07-21 NOTE — Progress Notes (Signed)
   PRENATAL VISIT NOTE  Subjective:  Krista Huffman is a 25 y.o. G1P0000 at [redacted]w[redacted]d being seen today for ongoing prenatal care.  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  Patient reports no complaints.  Contractions: Not present. Vag. Bleeding: None.  Movement: Present. Denies leaking of fluid/ROM.   The following portions of the patient's history were reviewed and updated as appropriate: allergies, current medications, past family history, past medical history, past social history, past surgical history and problem list. Problem list updated.  Objective:   Vitals:   07/21/20 1513  BP: 118/67  Pulse: 96  Temp: 99.4 F (37.4 C)  Weight: 257 lb 3.2 oz (116.7 kg)    Fetal Status: Fetal Heart Rate (bpm): 140 Fundal Height: 27 cm Movement: Present     General:  Alert, oriented and cooperative. Patient is in no acute distress.  Skin: Skin is warm and dry. No rash noted.   Cardiovascular: Normal heart rate noted  Respiratory: Normal respiratory effort, no problems with respiration noted  Abdomen: Soft, gravid, appropriate for gestational age.  Pain/Pressure: Absent     Pelvic: Cervical exam deferred        Extremities: Normal range of motion.  Edema: None  Mental Status: Normal mood and affect. Normal behavior. Normal judgment and thought content.   Assessment and Plan:  Pregnancy: G1P0000 at [redacted]w[redacted]d  1. Obesity affecting pregnancy, antepartum Taking ASA 81 mg daily 27 lb 3.2 oz (12.3 kg) Not exercising--encouraged to do 3x/wk x 20 min  2. Marijuana use (+UDS 03/22/20 MJ); +UDS MJ 06/15/20 Last use 06/24/20.  Pt agrees to UDS today - 409811 Drug Screen  3. Supervision of high risk pregnancy,  antepartum 1 hour glucola=114 on 03/22/20 Reviewed 06/24/20 u/s at 23 4/7 with EFW=62%, posterior placenta, AFI wnl, growth wnl To repeat u/a today due to 06/15/20 dip 1+ protein, 1+ ketone, tr RBC, hazy Lunch today (1100): chicken sandwich, fries, Dr. Reino Kent Denies UTI sxs Quit working 06/02/20  - [id number] Drug Screen - Urine Microscopic - Urinalysis (Urine Dip)  4. 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 Please refer to After Visit Summary for other counseling recommendations.  No follow-ups on file.  No future appointments.  Alberteen Spindle, CNM

## 2020-07-21 NOTE — Progress Notes (Signed)
Urine results reviewed by provider. No further orders given. Tawny Hopping, RN

## 2020-07-21 NOTE — Progress Notes (Signed)
Here today for 26.5 week MH RV. Taking PNV and ASA QD. Denies ED/hospital visits since last RV. PTL s/s info given and reviewed. Tawny Hopping, RN

## 2020-08-03 LAB — 789231 7+OXYCODONE-BUND
Amphetamines, Urine: NEGATIVE ng/mL
BENZODIAZ UR QL: NEGATIVE ng/mL
Barbiturate screen, urine: NEGATIVE ng/mL
Cocaine (Metab.): NEGATIVE ng/mL
OPIATE SCREEN URINE: NEGATIVE ng/mL
Oxycodone/Oxymorphone, Urine: NEGATIVE ng/mL
PCP Quant, Ur: NEGATIVE ng/mL

## 2020-08-03 LAB — CANNABINOID CONFIRMATION, UR
CANNABINOIDS: POSITIVE — AB
Carboxy THC GC/MS Conf: 184 ng/mL

## 2020-08-05 ENCOUNTER — Ambulatory Visit: Payer: Medicaid Other | Admitting: Physician Assistant

## 2020-08-05 ENCOUNTER — Encounter: Payer: Self-pay | Admitting: Physician Assistant

## 2020-08-05 ENCOUNTER — Other Ambulatory Visit: Payer: Self-pay

## 2020-08-05 VITALS — BP 131/83 | HR 92 | Temp 98.5°F | Wt 258.8 lb

## 2020-08-05 DIAGNOSIS — O0990 Supervision of high risk pregnancy, unspecified, unspecified trimester: Secondary | ICD-10-CM

## 2020-08-05 DIAGNOSIS — O99210 Obesity complicating pregnancy, unspecified trimester: Secondary | ICD-10-CM

## 2020-08-05 DIAGNOSIS — E669 Obesity, unspecified: Secondary | ICD-10-CM | POA: Insufficient documentation

## 2020-08-05 DIAGNOSIS — E66811 Obesity, class 1: Secondary | ICD-10-CM | POA: Insufficient documentation

## 2020-08-05 DIAGNOSIS — F1290 Cannabis use, unspecified, uncomplicated: Secondary | ICD-10-CM

## 2020-08-05 LAB — URINALYSIS
Bilirubin, UA: NEGATIVE
Glucose, UA: NEGATIVE
Nitrite, UA: NEGATIVE
RBC, UA: NEGATIVE
Specific Gravity, UA: 1.025 (ref 1.005–1.030)
Urobilinogen, Ur: 0.2 mg/dL (ref 0.2–1.0)
pH, UA: 7 (ref 5.0–7.5)

## 2020-08-05 LAB — HEMOGLOBIN, FINGERSTICK: Hemoglobin: 11 g/dL — ABNORMAL LOW (ref 11.1–15.9)

## 2020-08-05 NOTE — Progress Notes (Signed)
Presents for MH RV at 28.6 weeks. Takes PNV daily. Denies ED/ Hospital visiti since last RV. 28 week labs collected today. Hgb =11.0 no treatment needed. Tdap given today, tolerated well. Sharlyne Pacas, RN

## 2020-08-05 NOTE — Progress Notes (Signed)
   PRENATAL VISIT NOTE  Subjective:  Krista Huffman is a 25 y.o. G1P0000 at [redacted]w[redacted]d being seen today for ongoing prenatal care.  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  Patient reports occ HA (twice a week, resolves with Tylenol or resting in a dark room).  Contractions: Not present. Vag. Bleeding: None.  Movement: Present. Denies leaking of fluid/ROM.   The following portions of the patient's history were reviewed and updated as appropriate: allergies, current medications, past family history, past medical history, past social history, past surgical history and problem list. Problem list updated.  Objective:   Vitals:   08/05/20 1416  BP: 131/83  Pulse: 92  Temp: 98.5 F (36.9 C)  Weight: 258 lb 12.8 oz (117.4 kg)    Fetal Status: Fetal Heart Rate (bpm): 144 Fundal Height: 28 cm Movement: Present     General:  Alert, oriented and cooperative. Patient is in no acute distress.  Skin: Skin is warm and dry. No rash noted.   Cardiovascular: Normal heart rate noted  Respiratory: Normal respiratory effort, no problems with respiration noted  Abdomen: Soft, gravid, appropriate for gestational age.  Pain/Pressure: Absent     Pelvic: Cervical exam deferred        Extremities: Normal range of motion.  Edema: None  Mental Status: Normal mood and affect. Normal behavior. Normal judgment and thought content.   Assessment and Plan:  Pregnancy: G1P0000 at [redacted]w[redacted]d  1. Supervision of high risk pregnancy, antepartum TdaP today. BP elevated - CCUA shows 1+ pro, so spot ACR ordered. Had pt lie on L side for 10 min and repeat BP = 121/75. Pt has mild HA, no RUQ pain, edema or visual sx. Preeclampsia sx/procedures reveiwed with  pt. - Hemoglobin, venipuncture - HIV-1/HIV-2 Qualitative RNA - RPR - Glucose, 1 hour gestational - Urinalysis  2. Obesity affecting pregnancy, antepartum Pt taking low dose daily aspirin as instructed - to continue. Fetal growth clinically appropriate.  3. Marijuana use (+UDS 03/22/20 MJ); +UDS MJ 06/15/20; +MJ 07/21/20 Pt has decreased usage, enc complete cessation. "Plan of Safe Care" info given.  Preterm labor symptoms and general obstetric precautions including but not limited to vaginal bleeding, contractions, leaking of fluid and fetal movement were reviewed in detail with the patient. Please refer to After Visit Summary for other counseling recommendations.  Return in about 2 weeks (around 08/19/2020) for Routine prenatal care.  Future Appointments  Date Time Provider Department Center  08/19/2020  3:40 PM [facility] PROVIDER [facility] None    Landry Dyke, PA-C

## 2020-08-06 LAB — HIV-1/HIV-2 QUALITATIVE RNA
HIV-1 RNA, Qualitative: NONREACTIVE
HIV-2 RNA, Qualitative: NONREACTIVE

## 2020-08-06 LAB — PROTEIN / CREATININE RATIO, URINE
Creatinine, Urine: 124.6 mg/dL
Protein, Ur: 18.4 mg/dL
Protein/Creat Ratio: 148 mg/g creat (ref 0–200)

## 2020-08-06 LAB — GLUCOSE, 1 HOUR GESTATIONAL: Gestational Diabetes Screen: 89 mg/dL (ref 65–139)

## 2020-08-06 LAB — RPR: RPR Ser Ql: NONREACTIVE

## 2020-08-19 ENCOUNTER — Other Ambulatory Visit: Payer: Self-pay

## 2020-08-19 ENCOUNTER — Ambulatory Visit: Payer: Medicaid Other | Admitting: Physician Assistant

## 2020-08-19 ENCOUNTER — Encounter: Payer: Self-pay | Admitting: Physician Assistant

## 2020-08-19 VITALS — BP 122/65 | HR 89 | Temp 99.1°F | Wt 260.6 lb

## 2020-08-19 DIAGNOSIS — O99210 Obesity complicating pregnancy, unspecified trimester: Secondary | ICD-10-CM

## 2020-08-19 DIAGNOSIS — O0990 Supervision of high risk pregnancy, unspecified, unspecified trimester: Secondary | ICD-10-CM

## 2020-08-19 NOTE — Progress Notes (Signed)
   PRENATAL VISIT NOTE  Subjective:  Krista Huffman is a 25 y.o. G1P0000 at [redacted]w[redacted]d being seen today for ongoing prenatal care.  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  Patient reports occ sx c/w Braxton-Hicks contractions (several per week).  Contractions: Not present. Vag. Bleeding: None.  Movement: Present. Denies leaking of fluid/ROM.   The following portions of the patient's history were reviewed and updated as appropriate: allergies, current medications, past family history, past medical history, past social history, past surgical history and problem list. Problem list updated.  Objective:   Vitals:   08/19/20 1524  BP: 122/65  Pulse: 89  Temp: 99.1 F (37.3 C)  Weight: 260 lb 9.6 oz (118.2 kg)    Fetal Status: Fetal Heart Rate (bpm): 144 Fundal Height: 32 cm Movement: Present     General:  Alert, oriented and cooperative. Patient is in no acute distress.  Skin: Skin is warm and dry. No rash noted.   Cardiovascular: Normal heart rate noted  Respiratory: Normal respiratory effort, no problems with respiration noted  Abdomen: Soft, gravid, appropriate for gestational age.  Pain/Pressure: Absent     Pelvic: Cervical exam deferred        Extremities: Normal range of motion.  Edema: None  Mental Status: Normal mood and affect. Normal behavior. Normal judgment and thought content.   Assessment and Plan:  Pregnancy: G1P0000 at [redacted]w[redacted]d  1. Supervision of high risk pregnancy, antepartum Answered questions about infant circumcision - pt considering for son. Enc to contact intended pediatrician for current financing/procedures.  2. Obesity affecting pregnancy, antepartum 30+ lb wt gain thus far - over  recommended. Continue daily low-dose aspirin. Consider fetal growth [place] if unable to assess clinically.   Preterm labor symptoms and general obstetric precautions including but not limited to vaginal bleeding, contractions, leaking of fluid and fetal movement were reviewed in detail with the patient. Please refer to After Visit Summary for other counseling recommendations.  Return in about 2 weeks (around 09/02/2020) for Routine prenatal care.  Future Appointments  Date Time Provider Department Center  09/02/2020  3:00 PM [facility] PROVIDER [facility] None    Landry Dyke, PA-C

## 2020-09-02 ENCOUNTER — Ambulatory Visit: Payer: Medicaid Other

## 2020-09-06 ENCOUNTER — Ambulatory Visit: Payer: Medicaid Other | Admitting: Family Medicine

## 2020-09-06 ENCOUNTER — Other Ambulatory Visit: Payer: Self-pay

## 2020-09-06 VITALS — BP 123/79 | HR 84 | Temp 98.3°F | Wt 264.4 lb

## 2020-09-06 DIAGNOSIS — O0993 Supervision of high risk pregnancy, unspecified, third trimester: Secondary | ICD-10-CM

## 2020-09-06 DIAGNOSIS — O99210 Obesity complicating pregnancy, unspecified trimester: Secondary | ICD-10-CM

## 2020-09-06 DIAGNOSIS — O99213 Obesity complicating pregnancy, third trimester: Secondary | ICD-10-CM

## 2020-09-06 DIAGNOSIS — O0990 Supervision of high risk pregnancy, unspecified, unspecified trimester: Secondary | ICD-10-CM

## 2020-09-06 NOTE — Progress Notes (Signed)
   PRENATAL VISIT NOTE  Subjective:  Krista Huffman is a 25 y.o. G1P0000 at [redacted]w[redacted]d being seen today for ongoing prenatal care.  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  Patient reports no complaints.  Contractions: Not present. Vag. Bleeding: None.  Movement: Present. Denies leaking of fluid/ROM.   The following portions of the patient's history were reviewed and updated as appropriate: allergies, current medications, past family history, past medical history, past social history, past surgical history and problem list. Problem list updated.  Objective:   Vitals:   09/06/20 1100  BP: 123/79  Pulse: 84  Temp: 98.3 F (36.8 C)  Weight: 264 lb 6.4 oz (119.9 kg)    Fetal Status: Fetal Heart Rate (bpm): 146 Fundal Height: 34 cm Movement: Present     General:  Alert, oriented and cooperative. Patient is in no acute distress.  Skin: Skin is warm and dry. No rash noted.   Cardiovascular: Normal heart rate noted  Respiratory: Normal respiratory effort, no problems with respiration noted  Abdomen: Soft, gravid, appropriate for gestational age.  Pain/Pressure: Absent     Pelvic: Cervical exam deferred        Extremities: Normal range of motion.  Edema: None  Mental Status: Normal mood and affect. Normal behavior. Normal judgment and thought content.   Assessment and Plan:  Pregnancy: G1P0000 at [redacted]w[redacted]d  1. Supervision of high risk pregnancy, antepartum -As part of routine education we reviewed s/sx of pre-eclampsia and to seek medical care ASAP if present.  -PP contraception plan is depo provera  -Feeding plan is breast  -Reviewed kick counts.   2. Obesity affecting pregnancy, antepartum Discussed patient gain of  4 lbs since last visit.  Patient reports not eating breakfast most days because she sleeps in and eating various different foods throughout the day.   Discussed with patient complications of obesity and delivery.  Encouraged daily walking, small frequent meals, higher protein and less simple sugars.   Referral sent to [facility].     Preterm labor symptoms and general obstetric precautions including but not limited to vaginal bleeding, contractions, leaking of fluid and fetal movement were reviewed in detail with the patient. Please refer to After Visit Summary for other counseling recommendations.  Return in about 2 weeks (around 09/20/2020) for routine prenatal care.  Future Appointments  Date Time Provider Department Center  09/20/2020  1:40 PM [facility] PROVIDER [facility] None    Wendi Snipes, FNP

## 2020-09-06 NOTE — Progress Notes (Signed)
Patient here for MH RV at 33 3/7. Kick counts reviewed and cards given.Burt Knack, RN

## 2020-09-07 ENCOUNTER — Telehealth: Payer: Self-pay | Admitting: Dietician

## 2020-09-20 ENCOUNTER — Other Ambulatory Visit: Payer: Self-pay

## 2020-09-20 ENCOUNTER — Ambulatory Visit: Payer: Medicaid Other | Admitting: Family Medicine

## 2020-09-20 VITALS — BP 127/83 | HR 73 | Temp 98.4°F | Wt 273.6 lb

## 2020-09-20 DIAGNOSIS — O0990 Supervision of high risk pregnancy, unspecified, unspecified trimester: Secondary | ICD-10-CM

## 2020-09-20 DIAGNOSIS — O99213 Obesity complicating pregnancy, third trimester: Secondary | ICD-10-CM

## 2020-09-20 DIAGNOSIS — O0993 Supervision of high risk pregnancy, unspecified, third trimester: Secondary | ICD-10-CM

## 2020-09-20 DIAGNOSIS — O99210 Obesity complicating pregnancy, unspecified trimester: Secondary | ICD-10-CM

## 2020-09-20 LAB — URINALYSIS
Bilirubin, UA: NEGATIVE
Glucose, UA: NEGATIVE
Ketones, UA: NEGATIVE
Nitrite, UA: NEGATIVE
Protein,UA: NEGATIVE
RBC, UA: NEGATIVE
Specific Gravity, UA: 1.02 (ref 1.005–1.030)
Urobilinogen, Ur: 0.2 mg/dL (ref 0.2–1.0)
pH, UA: 7 (ref 5.0–7.5)

## 2020-09-20 NOTE — Progress Notes (Signed)
Patient here for MH RV at 35 3/7. C/O swelling in ankles and feet. Also states she felt her eyes were "puffy" this morning when she woke up.Burt Knack, RN

## 2020-09-20 NOTE — Progress Notes (Signed)
   PRENATAL VISIT NOTE  Subjective:  Krista Huffman is a 25 y.o. G1P0000 at [redacted]w[redacted]d being seen today for ongoing prenatal care.  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  Patient reports some vomiting after eating .  Contractions: Not present. Vag. Bleeding: None.  Movement: Present. Denies leaking of fluid/ROM.   The following portions of the patient's history were reviewed and updated as appropriate: allergies, current medications, past family history, past medical history, past social history, past surgical history and problem list. Problem list updated.  Objective:   Vitals:   09/20/20 1328  BP: 127/83  Pulse: 73  Temp: 98.4 F (36.9 C)  Weight: 273 lb 9.6 oz (124.1 kg)    Fetal Status: Fetal Heart Rate (bpm): 149 Fundal Height: 37 cm Movement: Present     General:  Alert, oriented and cooperative. Patient is in no acute distress.  Skin: Skin is warm and dry. No rash noted.   Cardiovascular: Normal heart rate noted  Respiratory: Normal respiratory effort, no problems with respiration noted  Abdomen: Soft, gravid, appropriate for gestational age.  Pain/Pressure: Absent     Pelvic: Cervical exam deferred        Extremities: Normal range of motion.  Edema: Trace  Mental Status: Normal mood and affect. Normal behavior. Normal judgment and thought content.   Assessment and Plan:  Pregnancy: G1P0000 at [redacted]w[redacted]d  1. Supervision of high risk pregnancy, antepartum -Discussed expectation of remaining visits  -36 weeks labs next week and starting weekly visits . -Patient has trace edema, but no pitting, patient had questions about puffiness around eyes,  Patient showed pictures, some puffiness noted under both eyes,  patient reports being outside for hours yesterday.  No current puffiness noted at this time.  Possible d/t allergies, encourage patient to monitor if this continues.    2. Obesity affecting pregnancy, antepartum Discussed weight gain, and diet RD was not able to contact patient, per note pt hung up on RD x 2"  Discussed risk of obesity in pregnancy and complications that it can cause.  Encourage to eat smaller frequent meals with more protein, less carbs, also limit added salt and high salt foods, and exercise.    B/p 127/86  - patient to stop by [facility] to reschedule appointment RD.   - Urinalysis (Urine Dip)   Preterm labor symptoms and general obstetric precautions including but not limited to vaginal bleeding, contractions, leaking of fluid and fetal movement were reviewed in detail with the patient. Please refer to After Visit Summary for other counseling recommendations.  Return in about 1 week (around 09/27/2020) for routine prenatal care, 36 week labs.  Future Appointments  Date Time Provider Department Center  09/27/2020  1:20 PM [facility] PROVIDER [facility] None    Wendi Snipes, FNP

## 2020-09-27 ENCOUNTER — Ambulatory Visit: Payer: Medicaid Other

## 2020-09-28 ENCOUNTER — Ambulatory Visit: Payer: Medicaid Other | Admitting: Family Medicine

## 2020-09-28 ENCOUNTER — Other Ambulatory Visit: Payer: Self-pay

## 2020-09-28 VITALS — BP 131/85 | HR 85 | Temp 97.8°F | Wt 274.0 lb

## 2020-09-28 DIAGNOSIS — O0990 Supervision of high risk pregnancy, unspecified, unspecified trimester: Secondary | ICD-10-CM

## 2020-09-28 DIAGNOSIS — F1290 Cannabis use, unspecified, uncomplicated: Secondary | ICD-10-CM

## 2020-09-28 DIAGNOSIS — O99213 Obesity complicating pregnancy, third trimester: Secondary | ICD-10-CM

## 2020-09-28 DIAGNOSIS — O0993 Supervision of high risk pregnancy, unspecified, third trimester: Secondary | ICD-10-CM

## 2020-09-28 DIAGNOSIS — O99210 Obesity complicating pregnancy, unspecified trimester: Secondary | ICD-10-CM

## 2020-09-28 LAB — URINALYSIS
Bilirubin, UA: NEGATIVE
Glucose, UA: NEGATIVE
Leukocytes,UA: NEGATIVE
Nitrite, UA: NEGATIVE
Protein,UA: NEGATIVE
Specific Gravity, UA: 1.03 (ref 1.005–1.030)
Urobilinogen, Ur: 1 mg/dL (ref 0.2–1.0)
pH, UA: 7 (ref 5.0–7.5)

## 2020-09-28 NOTE — Progress Notes (Signed)
   PRENATAL VISIT NOTE  Subjective:  Krista Huffman is a 25 y.o. G1P0000 at [redacted]w[redacted]d being seen today for ongoing prenatal care.  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  Patient reports no complaints.  Contractions: Irritability. Vag. Bleeding: None.  Movement: Present. Denies leaking of fluid/ROM.   The following portions of the patient's history were reviewed and updated as appropriate: allergies, current medications, past family history, past medical history, past social history, past surgical history and problem list. Problem list updated.  Objective:   Vitals:   09/28/20 0846 09/28/20 0910  BP: 137/87 131/85  Pulse: 89 85  Temp: 97.8 F (36.6 C)   Weight: 274 lb (124.3 kg)     Fetal Status: Fetal Heart Rate (bpm): 152 Fundal Height: 38 cm Movement: Present  Presentation: Vertex  General:  Alert, oriented and cooperative. Patient is in no acute distress.  Skin: Skin is warm and dry. No rash noted.   Cardiovascular: Normal heart rate noted  Respiratory: Normal respiratory effort, no problems with respiration noted  Abdomen: Soft, gravid, appropriate for gestational age.  Pain/Pressure: Absent     Pelvic: Cervical exam deferred        Extremities: Normal range of motion.  Edema: Trace  Mental Status: Normal mood and affect. Normal behavior. Normal judgment and thought content.   Assessment and Plan:  Pregnancy: G1P0000 at [redacted]w[redacted]d  1. Supervision of high risk pregnancy, antepartum 36 week labs today,  Discussed visit frequency changes   - Urinalysis (Urine Dip) - GBS Culture - Chlamydia/GC NAA, Confirmation  2. Obesity affecting pregnancy, antepartum - patient reports went to [facility]ic to talk with  RD -started swimming last week, 2 x in the week  - Urinalysis (Urine Dip)  3. Marijuana use (+UDS 03/22/20 MJ); +UDS MJ 06/15/20; +MJ 07/21/20 Last used edible 2 weeks ago Counseled on substance use   - 865784 Drug Screen    Preterm labor symptoms and general obstetric precautions including but not limited to vaginal bleeding, contractions, leaking of fluid and fetal movement were reviewed in detail with the patient. Please refer to After Visit Summary for other counseling recommendations.  Return in about 1 week (around 10/05/2020) for routine prenatal care.  Future Appointments  Date Time Provider Department Center  10/05/2020  1:20 PM [facility] PROVIDER [facility] None    Wendi Snipes, FNP

## 2020-09-28 NOTE — Progress Notes (Signed)
Patient here for RV at [redacted]w[redacted]d.  Provider made aware of slightly elevated BP.  Urine drip reviewed with provider - protein negative.  Patient to return in 1 week for RV.  GBS and GC/clamydia collected today.  36 week packet given to patient.   Floy Sabina, RN

## 2020-10-02 LAB — CULTURE, BETA STREP (GROUP B ONLY): Strep Gp B Culture: NEGATIVE

## 2020-10-03 LAB — C. TRACHOMATIS NAA, CONFIRM: C. trachomatis NAA, Confirm: POSITIVE — AB

## 2020-10-03 LAB — CHLAMYDIA/GC NAA, CONFIRMATION
Chlamydia trachomatis, NAA: POSITIVE — AB
Neisseria gonorrhoeae, NAA: NEGATIVE

## 2020-10-04 ENCOUNTER — Telehealth: Payer: Self-pay

## 2020-10-04 NOTE — Telephone Encounter (Signed)
Patient returned call after voicemail message from [place], [place].   Relied message from [place] and patient aware now of positive chlamydia and that she will be treated 10/05/20 during her OB return appointment. Patient stated she was aware because she saw the results on mychart.   Floy Sabina, RN

## 2020-10-04 NOTE — Progress Notes (Signed)
GC/Chlam and GBS labs reviewed, and no result note made. GBS negative, and needs treatment for Chlamydia. Gonorrhea negative.TC to patient, to inform of need for Chlamydia treatment, LM with number to call. Patient has a scheduled MH appointment tomorrow, 10/05/2020.Marland KitchenBurt Knack, RN

## 2020-10-04 NOTE — Telephone Encounter (Signed)
TC to patient to inform of need for treatment for Chlamydia positive test result. Patient has an appointment on 10/05/2020. LM today asking patient to call [facility]. LM with number to call. If patient doesn't return call she will be informed and treated tomorrow at her [facility] RV appointment.Burt Knack, RN

## 2020-10-05 ENCOUNTER — Other Ambulatory Visit: Payer: Self-pay

## 2020-10-05 ENCOUNTER — Ambulatory Visit: Payer: Medicaid Other | Admitting: Advanced Practice Midwife

## 2020-10-05 ENCOUNTER — Other Ambulatory Visit: Payer: Self-pay | Admitting: Advanced Practice Midwife

## 2020-10-05 VITALS — BP 135/86 | HR 86 | Temp 98.1°F | Wt 275.2 lb

## 2020-10-05 DIAGNOSIS — F1290 Cannabis use, unspecified, uncomplicated: Secondary | ICD-10-CM

## 2020-10-05 DIAGNOSIS — A749 Chlamydial infection, unspecified: Secondary | ICD-10-CM

## 2020-10-05 DIAGNOSIS — O0993 Supervision of high risk pregnancy, unspecified, third trimester: Secondary | ICD-10-CM

## 2020-10-05 DIAGNOSIS — O99210 Obesity complicating pregnancy, unspecified trimester: Secondary | ICD-10-CM

## 2020-10-05 DIAGNOSIS — O99213 Obesity complicating pregnancy, third trimester: Secondary | ICD-10-CM

## 2020-10-05 DIAGNOSIS — O0990 Supervision of high risk pregnancy, unspecified, unspecified trimester: Secondary | ICD-10-CM

## 2020-10-05 LAB — URINALYSIS
Bilirubin, UA: NEGATIVE
Glucose, UA: NEGATIVE
Leukocytes,UA: NEGATIVE
Nitrite, UA: NEGATIVE
Specific Gravity, UA: 1.025 (ref 1.005–1.030)
Urobilinogen, Ur: 0.2 mg/dL (ref 0.2–1.0)
pH, UA: 7 (ref 5.0–7.5)

## 2020-10-05 MED ORDER — AZITHROMYCIN 500 MG PO TABS
1000.0000 mg | ORAL_TABLET | Freq: Once | ORAL | Status: AC
Start: 2020-10-05 — End: 2020-10-05
  Administered 2020-10-05: 1000 mg via ORAL

## 2020-10-05 NOTE — Addendum Note (Signed)
Addended by: Tracey Harries on: 10/05/2020 02:02 PM   Modules accepted: Orders

## 2020-10-05 NOTE — Progress Notes (Signed)
   PRENATAL VISIT NOTE  Subjective:  Krista Huffman is a 25 y.o. G1P0000 at [redacted]w[redacted]d being seen today for ongoing prenatal care.  She is currently monitored for the following issues for this high-risk pregnancy and has Obesity affecting pregnancy, antepartum; Supervision of high risk pregnancy, antepartum; Marijuana use +UDS 03/22/20, 06/15/20, 07/21/20; Low-level of literacy completed 9th grade; COVID-19 virus infection 06/04/20; Obesity (BMI 30.0-34.9); and Chlamydia on their problem list.  Patient reports no complaints.  Contractions: Not present. Vag. Bleeding: None.  Movement: Present. Denies leaking of fluid/ROM.   The following portions of the patient's history were reviewed and updated as appropriate: allergies, current medications, past family history, past medical history, past social history, past surgical history and problem list. Problem list updated.  Objective:   Vitals:   10/05/20 1308  BP: 135/86  Pulse: 86  Temp: 98.1 F (36.7 C)  Weight: 275 lb 3.2 oz (124.8 kg)    Fetal Status: Fetal Heart Rate (bpm): 140 Fundal Height: 39 cm Movement: Present  Presentation: Vertex  General:  Alert, oriented and cooperative. Patient is in no acute distress.  Skin: Skin is warm and dry. No rash noted.   Cardiovascular: Normal heart rate noted  Respiratory: Normal respiratory effort, no problems with respiration noted  Abdomen: Soft, gravid, appropriate for gestational age.  Pain/Pressure: Absent     Pelvic: Cervical exam deferred        Extremities: Normal range of motion.  Edema: Trace  Mental Status: Normal mood and affect. Normal behavior. Normal judgment and thought content.   Assessment and Plan:  Pregnancy: G1P0000 at [redacted]w[redacted]d  1. Chlamydia + 09/28/20--to treat today per standing orders  2. Obesity affecting pregnancy, antepartum 45 lb 3.2 oz (20.5 kg) Stopped taking ASA 81 mg Swimming 2x/wk x 20 min  3. Supervision of high risk pregnancy, antepartum BP 135/86; denies  scotoma, epigastric pain, +h/a 1x/wk relieved with Tylenol. Trace pretibial edema--suggestions given.  Not working.  Has car seat and ready for baby at home Knows when to go to L&D - Urinalysis (Urine Dip) - 161096 Drug Screen - Protein / creatinine ratio, urine  (Spot)  4. Marijuana use +UDS 03/22/20, 06/15/20, 07/21/20 Edibles 1x/wk with last use 09/30/20 Agrees to UDS today   Preterm labor symptoms and general obstetric precautions including but not limited to vaginal bleeding, contractions, leaking of fluid and fetal movement were reviewed in detail with the patient. Please refer to After Visit Summary for other counseling recommendations.  Return in about 1 week (around 10/12/2020) for routine PNC.  No future appointments.  Alberteen Spindle, CNM

## 2020-10-05 NOTE — Progress Notes (Signed)
Provider reviewed UA, no tx. Pt treated for chlamydia per standing order with Azithromycin. Provider orders completed.

## 2020-10-05 NOTE — Progress Notes (Signed)
Pt denies visits to ER since last appt at [facility]. Pt taking PNV.

## 2020-10-12 ENCOUNTER — Ambulatory Visit: Payer: Medicaid Other | Admitting: Advanced Practice Midwife

## 2020-10-12 ENCOUNTER — Other Ambulatory Visit: Payer: Self-pay

## 2020-10-12 ENCOUNTER — Encounter: Payer: Self-pay | Admitting: Advanced Practice Midwife

## 2020-10-12 VITALS — BP 133/80 | Temp 98.8°F | Wt 276.8 lb

## 2020-10-12 DIAGNOSIS — O0990 Supervision of high risk pregnancy, unspecified, unspecified trimester: Secondary | ICD-10-CM

## 2020-10-12 DIAGNOSIS — F1290 Cannabis use, unspecified, uncomplicated: Secondary | ICD-10-CM

## 2020-10-12 DIAGNOSIS — E669 Obesity, unspecified: Secondary | ICD-10-CM

## 2020-10-12 DIAGNOSIS — O99210 Obesity complicating pregnancy, unspecified trimester: Secondary | ICD-10-CM

## 2020-10-12 LAB — URINALYSIS
Bilirubin, UA: POSITIVE — AB
Glucose, UA: NEGATIVE
Nitrite, UA: NEGATIVE
Specific Gravity, UA: 1.03 (ref 1.005–1.030)
Urobilinogen, Ur: 1 mg/dL (ref 0.2–1.0)
pH, UA: 6 (ref 5.0–7.5)

## 2020-10-12 NOTE — Progress Notes (Addendum)
   PRENATAL VISIT NOTE  Subjective:  Krista Huffman is a 25 y.o. G1P0000 at [redacted]w[redacted]d being seen today for ongoing prenatal care.  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  Patient reports stuffy nose x2 days without fever, sore throat.  Contractions: Not present. Vag. Bleeding: None.  Movement: Present. Denies leaking of fluid/ROM.   The following portions of the patient's history were reviewed and updated as appropriate: allergies, current medications, past family history, past medical history, past social history, past surgical history and problem list. Problem list updated.  Objective:   Vitals:   10/12/20 1339  BP: 133/80  Temp: 98.8 F (37.1 C)  Weight: 276 lb 12.8 oz (125.6 kg)    Fetal Status: Fetal Heart Rate (bpm): 150 Fundal Height: 39 cm Movement: Present  Presentation: Vertex  General:  Alert, oriented and cooperative. Patient is in no acute distress.  Skin: Skin is warm and dry. No rash noted.   Cardiovascular: Normal heart rate noted  Respiratory: Normal respiratory effort, no problems with respiration noted  Abdomen: Soft, gravid, appropriate for gestational age.  Pain/Pressure: Absent     Pelvic: Cervical exam deferred        Extremities: Normal range of motion.  Edema: Trace  Mental Status: Normal mood and affect. Normal behavior. Normal judgment and thought content.   Assessment and Plan:  Pregnancy: G1P0000 at [redacted]w[redacted]d  1. Obesity (BMI 30.0-34.9) 46 lb 12.8 oz (21.2 kg) Swimming 2x/wk x 20 min  2. Supervision of high risk pregnancy, antepartum Knows when to go to L&D Encouraged covid test for symptoms Has car seat BP was 135/86 last week  with neg proteinuria and 133/80 today with 2+ proteinuria, tr blood; denies h/a, scotoma, epigastric pain, tr edema. Pt prefers to not go to L&D now but return Thursday for RV and BP check and states will go to L&D with h/a, scotoma, decreased fetal movement, increased edema. Spot protein/creat ratio ordered - Urinalysis (Urine Dip)  3. Marijuana use +UDS 03/22/20, 06/15/20, 07/21/20 Last edible 09/30/20   Term labor symptoms and general obstetric precautions including but not limited to vaginal bleeding, contractions, leaking of fluid and fetal movement were reviewed in detail with the patient. Please refer to After Visit Summary for other counseling recommendations.  No follow-ups on file.  No future appointments.  Alberteen Spindle, CNM

## 2020-10-12 NOTE — Progress Notes (Signed)
Urine dip reviewed by E. Sciora CNM. Jossie Ng, RN

## 2020-10-12 NOTE — Addendum Note (Signed)
Addended by: Arnetha Courser on: 10/12/2020 02:39 PM   Modules accepted: Orders

## 2020-10-13 LAB — PROTEIN / CREATININE RATIO, URINE
Creatinine, Urine: 45 mg/dL
Protein, Ur: 11.6 mg/dL
Protein/Creat Ratio: 258 mg/g creat — ABNORMAL HIGH (ref 0–200)

## 2020-10-14 ENCOUNTER — Ambulatory Visit: Payer: Medicaid Other | Admitting: Physician Assistant

## 2020-10-14 ENCOUNTER — Other Ambulatory Visit: Payer: Self-pay

## 2020-10-14 VITALS — BP 137/87 | HR 94 | Temp 98.4°F | Wt 276.6 lb

## 2020-10-14 DIAGNOSIS — O0993 Supervision of high risk pregnancy, unspecified, third trimester: Secondary | ICD-10-CM

## 2020-10-14 DIAGNOSIS — O99210 Obesity complicating pregnancy, unspecified trimester: Secondary | ICD-10-CM

## 2020-10-14 LAB — URINALYSIS
Bilirubin, UA: NEGATIVE
Glucose, UA: NEGATIVE
Nitrite, UA: NEGATIVE
Specific Gravity, UA: 1.03 (ref 1.005–1.030)
Urobilinogen, Ur: 0.2 mg/dL (ref 0.2–1.0)
pH, UA: 7 (ref 5.0–7.5)

## 2020-10-14 LAB — 789231 7+OXYCODONE-BUND
Amphetamines, Urine: NEGATIVE ng/mL
BENZODIAZ UR QL: NEGATIVE ng/mL
Barbiturate screen, urine: NEGATIVE ng/mL
Cocaine (Metab.): NEGATIVE ng/mL
OPIATE SCREEN URINE: NEGATIVE ng/mL
Oxycodone/Oxymorphone, Urine: NEGATIVE ng/mL
PCP Quant, Ur: NEGATIVE ng/mL

## 2020-10-14 LAB — CANNABINOID CONFIRMATION, UR
CANNABINOIDS: POSITIVE — AB
Carboxy THC GC/MS Conf: 154 ng/mL

## 2020-10-14 NOTE — Progress Notes (Signed)
   PRENATAL VISIT NOTE  Subjective:  Krista Huffman is a 25 y.o. G1P0000 at [redacted]w[redacted]d being seen today for ongoing prenatal care.  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  Patient reports mild cold symptoms for 3 days, using cough drops.  Contractions: Not present. Vag. Bleeding: None.  Movement: Present. Denies leaking of fluid/ROM.   The following portions of the patient's history were reviewed and updated as appropriate: allergies, current medications, past family history, past medical history, past social history, past surgical history and problem list. Problem list updated.  Objective:   Vitals:   10/14/20 1426  BP: 137/87  Pulse: 94  Temp: 98.4 F (36.9 C)  Weight: 276 lb 9.6 oz (125.5 kg)    Fetal Status: Fetal Heart Rate (bpm): 156 Fundal Height: 40 cm Movement: Present     General:  Alert, oriented and cooperative. Patient is in no acute distress.  Skin: Skin is warm and dry. No rash noted.   Cardiovascular: Normal heart rate noted  Respiratory: Normal respiratory effort, no problems with respiration noted  Abdomen: Soft, gravid, appropriate for gestational age.  Pain/Pressure: Absent     Pelvic: Cervical exam deferred        Extremities: Normal range of motion.  Edema: Trace  Mental Status: Normal mood and affect. Normal behavior. Normal judgment and thought content.   Assessment and Plan:  Pregnancy: G1P0000 at [redacted]w[redacted]d  1. High-risk pregnancy in third trimester Reviewed urine SAY:TKZSW ratio from 5/17, elevated at 258. Pt denies visual sx, RUQ pain, significant edema. Systolic BP elevated, but not above 140. CCUA pro today continues at 2+. Recheck  spot ACR, pt to monitor for preeclampsia sx and RTC 4 d for reassessment. - Urinalysis (Urine Dip) - Protein / creatinine ratio, urine  2. Obesity affecting pregnancy, antepartum    Term labor symptoms and general obstetric precautions including but not limited to vaginal bleeding, contractions, leaking of fluid and fetal movement were reviewed in detail with the patient. Please refer to After Visit Summary for other counseling recommendations.  Return in about 4 days (around 10/18/2020) for reassess BP/protein/sx.  Future Appointments  Date Time Provider Department Center  10/18/2020  1:20 PM [facility] PROVIDER [facility] None    Landry Dyke, PA-C

## 2020-10-14 NOTE — Progress Notes (Addendum)
Urine dip reviewed by A. Streilein PA-C. Verified with lab that client had enough urine for spot protein - ordered by provider. Per A. Knute Neu, client to return on 10/18/2020. Appt overbooked and client to arrive 10/18/2020 at 1300. Jossie Ng, RN

## 2020-10-15 ENCOUNTER — Telehealth: Payer: Self-pay

## 2020-10-15 LAB — PROTEIN / CREATININE RATIO, URINE
Creatinine, Urine: 183.3 mg/dL
Protein, Ur: 66.9 mg/dL
Protein/Creat Ratio: 365 mg/g creat — ABNORMAL HIGH (ref 0–200)

## 2020-10-15 NOTE — Telephone Encounter (Signed)
TC to patient following consult with Dr. Alvester Morin regarding patient's urine spot prot/creat yesterday. Values elevated above values from 3 days ago. LM for patient counseling her that we will discuss her values at her appointment on Monday, 10/18/2020, but that she should go to the ED if she has a severe headache, new swelling in face or body, right upper abdominal (quadrant) pain, and/or changes in her vision--blurry, spots, flashing lights.Burt Knack, RN

## 2020-10-18 ENCOUNTER — Ambulatory Visit: Payer: Self-pay

## 2020-10-19 ENCOUNTER — Ambulatory Visit: Payer: Medicaid Other | Admitting: Family Medicine

## 2020-10-19 ENCOUNTER — Other Ambulatory Visit: Payer: Self-pay

## 2020-10-19 ENCOUNTER — Encounter: Payer: Self-pay | Admitting: Obstetrics and Gynecology

## 2020-10-19 ENCOUNTER — Inpatient Hospital Stay
Admission: EM | Admit: 2020-10-19 | Discharge: 2020-10-24 | DRG: 786 | Disposition: A | Discharge status: Home / Self Care | Payer: Medicaid Other | Attending: Certified Nurse Midwife | Admitting: Certified Nurse Midwife

## 2020-10-19 VITALS — BP 163/90 | HR 76 | Temp 97.9°F | Wt 276.8 lb

## 2020-10-19 DIAGNOSIS — O9081 Anemia of the puerperium: Secondary | ICD-10-CM | POA: Diagnosis not present

## 2020-10-19 DIAGNOSIS — O139 Gestational [pregnancy-induced] hypertension without significant proteinuria, unspecified trimester: Secondary | ICD-10-CM | POA: Diagnosis present

## 2020-10-19 DIAGNOSIS — O411230 Chorioamnionitis, third trimester, not applicable or unspecified: Secondary | ICD-10-CM | POA: Diagnosis present

## 2020-10-19 DIAGNOSIS — Z3A39 39 weeks gestation of pregnancy: Secondary | ICD-10-CM

## 2020-10-19 DIAGNOSIS — O99324 Drug use complicating childbirth: Secondary | ICD-10-CM | POA: Diagnosis present

## 2020-10-19 DIAGNOSIS — D62 Acute posthemorrhagic anemia: Secondary | ICD-10-CM | POA: Diagnosis not present

## 2020-10-19 DIAGNOSIS — Z23 Encounter for immunization: Secondary | ICD-10-CM | POA: Diagnosis not present

## 2020-10-19 DIAGNOSIS — A749 Chlamydial infection, unspecified: Secondary | ICD-10-CM

## 2020-10-19 DIAGNOSIS — O0993 Supervision of high risk pregnancy, unspecified, third trimester: Secondary | ICD-10-CM

## 2020-10-19 DIAGNOSIS — F1290 Cannabis use, unspecified, uncomplicated: Secondary | ICD-10-CM | POA: Diagnosis present

## 2020-10-19 DIAGNOSIS — O1414 Severe pre-eclampsia complicating childbirth: Principal | ICD-10-CM | POA: Diagnosis present

## 2020-10-19 DIAGNOSIS — O99214 Obesity complicating childbirth: Secondary | ICD-10-CM | POA: Diagnosis present

## 2020-10-19 DIAGNOSIS — O1493 Unspecified pre-eclampsia, third trimester: Secondary | ICD-10-CM

## 2020-10-19 DIAGNOSIS — Z8616 Personal history of COVID-19: Secondary | ICD-10-CM

## 2020-10-19 DIAGNOSIS — O99210 Obesity complicating pregnancy, unspecified trimester: Secondary | ICD-10-CM

## 2020-10-19 DIAGNOSIS — Z20822 Contact with and (suspected) exposure to covid-19: Secondary | ICD-10-CM | POA: Diagnosis present

## 2020-10-19 DIAGNOSIS — O133 Gestational [pregnancy-induced] hypertension without significant proteinuria, third trimester: Secondary | ICD-10-CM

## 2020-10-19 DIAGNOSIS — O99213 Obesity complicating pregnancy, third trimester: Secondary | ICD-10-CM

## 2020-10-19 DIAGNOSIS — R030 Elevated blood-pressure reading, without diagnosis of hypertension: Secondary | ICD-10-CM | POA: Diagnosis present

## 2020-10-19 DIAGNOSIS — O0990 Supervision of high risk pregnancy, unspecified, unspecified trimester: Secondary | ICD-10-CM

## 2020-10-19 LAB — CBC
HCT: 34.2 % — ABNORMAL LOW (ref 36.0–46.0)
Hemoglobin: 12 g/dL (ref 12.0–15.0)
MCH: 28.6 pg (ref 26.0–34.0)
MCHC: 35.1 g/dL (ref 30.0–36.0)
MCV: 81.6 fL (ref 80.0–100.0)
Platelets: 329 10*3/uL (ref 150–400)
RBC: 4.19 MIL/uL (ref 3.87–5.11)
RDW: 12.7 % (ref 11.5–15.5)
WBC: 11.9 10*3/uL — ABNORMAL HIGH (ref 4.0–10.5)
nRBC: 0 % (ref 0.0–0.2)

## 2020-10-19 LAB — COMPREHENSIVE METABOLIC PANEL
ALT: 16 U/L (ref 0–44)
AST: 24 U/L (ref 15–41)
Albumin: 3 g/dL — ABNORMAL LOW (ref 3.5–5.0)
Alkaline Phosphatase: 124 U/L (ref 38–126)
Anion gap: 10 (ref 5–15)
BUN: 7 mg/dL (ref 6–20)
CO2: 20 mmol/L — ABNORMAL LOW (ref 22–32)
Calcium: 9.2 mg/dL (ref 8.9–10.3)
Chloride: 107 mmol/L (ref 98–111)
Creatinine, Ser: 0.5 mg/dL (ref 0.44–1.00)
GFR, Estimated: >60 mL/min (ref >60)
Glucose, Bld: 80 mg/dL (ref 70–99)
Potassium: 3.6 mmol/L (ref 3.5–5.1)
Sodium: 137 mmol/L (ref 135–145)
Total Bilirubin: 0.6 mg/dL (ref 0.3–1.2)
Total Protein: 7.1 g/dL (ref 6.5–8.1)

## 2020-10-19 LAB — URINE DRUG SCREEN, QUALITATIVE (ARMC ONLY)
Amphetamines, Ur Screen: NOT DETECTED
Barbiturates, Ur Screen: NOT DETECTED
Benzodiazepine, Ur Scrn: NOT DETECTED
Cannabinoid 50 Ng, Ur ~~LOC~~: POSITIVE — AB
Cocaine Metabolite,Ur ~~LOC~~: NOT DETECTED
MDMA (Ecstasy)Ur Screen: NOT DETECTED
Methadone Scn, Ur: NOT DETECTED
Opiate, Ur Screen: NOT DETECTED
Phencyclidine (PCP) Ur S: NOT DETECTED
Tricyclic, Ur Screen: NOT DETECTED

## 2020-10-19 LAB — CHLAMYDIA/NGC RT PCR (ARMC ONLY)
Chlamydia Tr: NOT DETECTED
N gonorrhoeae: NOT DETECTED

## 2020-10-19 LAB — TYPE AND SCREEN
ABO/RH(D): O POS
Antibody Screen: NEGATIVE

## 2020-10-19 LAB — ABO/RH: ABO/RH(D): O POS

## 2020-10-19 LAB — RESP PANEL BY RT-PCR (FLU A&B, COVID) ARPGX2
Influenza A by PCR: NEGATIVE
Influenza B by PCR: NEGATIVE
SARS Coronavirus 2 by RT PCR: NEGATIVE

## 2020-10-19 LAB — PROTEIN / CREATININE RATIO, URINE
Creatinine, Urine: 150 mg/dL
Protein Creatinine Ratio: 0.69 mg/mg{Cre} — ABNORMAL HIGH (ref 0.00–0.15)
Total Protein, Urine: 104 mg/dL

## 2020-10-19 MED ORDER — OXYTOCIN BOLUS FROM INFUSION: 333.0000 mL | Freq: Once | INTRAVENOUS | Status: DC

## 2020-10-19 MED ORDER — MISOPROSTOL 25 MCG QUARTER TABLET: 25.0000 ug | ORAL_TABLET | ORAL | Status: DC | PRN

## 2020-10-19 MED ORDER — LACTATED RINGERS IV SOLN: 500.0000 mL | INTRAVENOUS | Status: DC | PRN

## 2020-10-19 MED ORDER — TERBUTALINE SULFATE 1 MG/ML IJ SOLN: 0.2500 mg | Freq: Once | INTRAMUSCULAR | Status: DC | PRN

## 2020-10-19 MED ORDER — OXYTOCIN-SODIUM CHLORIDE 30-0.9 UT/500ML-% IV SOLN
1.0000 m[IU]/min | INTRAVENOUS | Status: DC
Start: 2020-10-19 — End: 2020-10-22
  Administered 2020-10-20 – 2020-10-21 (×2): 2 m[IU]/min via INTRAVENOUS
  Filled 2020-10-19: qty 500

## 2020-10-19 MED ORDER — ACETAMINOPHEN 500 MG PO TABS
1000.0000 mg | ORAL_TABLET | Freq: Four times a day (QID) | ORAL | Status: DC | PRN
  Administered 2020-10-19 – 2020-10-21 (×6): 1000 mg via ORAL
  Filled 2020-10-19 (×8): qty 2

## 2020-10-19 MED ORDER — OXYTOCIN-SODIUM CHLORIDE 30-0.9 UT/500ML-% IV SOLN
2.5000 [IU]/h | INTRAVENOUS | Status: DC
  Administered 2020-10-22: 30 [IU] via INTRAVENOUS
  Filled 2020-10-19 (×2): qty 1000

## 2020-10-19 MED ORDER — SOD CITRATE-CITRIC ACID 500-334 MG/5ML PO SOLN
30.0000 mL | ORAL | Status: DC | PRN
  Filled 2020-10-19: qty 15

## 2020-10-19 MED ORDER — SODIUM CHLORIDE 0.9% FLUSH: 3.0000 mL | Freq: Two times a day (BID) | INTRAVENOUS | Status: DC

## 2020-10-19 MED ORDER — SODIUM CHLORIDE 0.9 % IV SOLN: 250.0000 mL | INTRAVENOUS | Status: DC | PRN

## 2020-10-19 MED ORDER — LABETALOL HCL 5 MG/ML IV SOLN
80.0000 mg | INTRAVENOUS | Status: DC | PRN
  Administered 2020-10-20: 80 mg via INTRAVENOUS
  Filled 2020-10-19: qty 16

## 2020-10-19 MED ORDER — MAGNESIUM SULFATE 40 GM/1000ML IV SOLN
INTRAVENOUS | Status: AC
  Administered 2020-10-19: 4 g via INTRAVENOUS
  Filled 2020-10-19: qty 1000

## 2020-10-19 MED ORDER — CALCIUM GLUCONATE 10 % IV SOLN
INTRAVENOUS | Status: AC
  Filled 2020-10-19: qty 10

## 2020-10-19 MED ORDER — LABETALOL HCL 5 MG/ML IV SOLN
40.0000 mg | INTRAVENOUS | Status: DC | PRN
  Administered 2020-10-19 – 2020-10-21 (×3): 40 mg via INTRAVENOUS
  Filled 2020-10-19 (×3): qty 8

## 2020-10-19 MED ORDER — LIDOCAINE HCL (PF) 1 % IJ SOLN
INTRAMUSCULAR | Status: AC
  Filled 2020-10-19: qty 30

## 2020-10-19 MED ORDER — MISOPROSTOL 25 MCG QUARTER TABLET
25.0000 ug | ORAL_TABLET | ORAL | Status: DC | PRN
  Administered 2020-10-19 (×2): 25 ug via BUCCAL
  Filled 2020-10-19 (×2): qty 1

## 2020-10-19 MED ORDER — ONDANSETRON HCL 4 MG/2ML IJ SOLN
4.0000 mg | Freq: Four times a day (QID) | INTRAMUSCULAR | Status: DC | PRN
  Administered 2020-10-20 (×2): 4 mg via INTRAVENOUS
  Filled 2020-10-19 (×3): qty 2

## 2020-10-19 MED ORDER — MISOPROSTOL 25 MCG QUARTER TABLET
25.0000 ug | ORAL_TABLET | ORAL | Status: DC | PRN
  Administered 2020-10-19 – 2020-10-20 (×4): 25 ug via VAGINAL
  Filled 2020-10-19 (×4): qty 1

## 2020-10-19 MED ORDER — CALCIUM CARBONATE ANTACID 500 MG PO CHEW
400.0000 mg | CHEWABLE_TABLET | Freq: Three times a day (TID) | ORAL | Status: DC | PRN
  Administered 2020-10-20: 400 mg via ORAL
  Filled 2020-10-19: qty 2

## 2020-10-19 MED ORDER — MISOPROSTOL 200 MCG PO TABS
ORAL_TABLET | ORAL | Status: AC
  Filled 2020-10-19: qty 4

## 2020-10-19 MED ORDER — AMMONIA AROMATIC IN INHA
RESPIRATORY_TRACT | Status: AC
  Filled 2020-10-19: qty 10

## 2020-10-19 MED ORDER — BUTORPHANOL TARTRATE 1 MG/ML IJ SOLN: 1.0000 mg | INTRAMUSCULAR | Status: DC | PRN

## 2020-10-19 MED ORDER — MAGNESIUM SULFATE BOLUS VIA INFUSION
4.0000 g | Freq: Once | INTRAVENOUS | Status: AC
  Filled 2020-10-19: qty 1000

## 2020-10-19 MED ORDER — MAGNESIUM SULFATE 40 GM/1000ML IV SOLN
2.0000 g/h | INTRAVENOUS | Status: DC
  Administered 2020-10-19 – 2020-10-22 (×4): 2 g/h via INTRAVENOUS
  Filled 2020-10-19 (×3): qty 1000

## 2020-10-19 MED ORDER — LACTATED RINGERS IV SOLN: INTRAVENOUS | Status: DC

## 2020-10-19 MED ORDER — HYDRALAZINE HCL 20 MG/ML IJ SOLN: 10.0000 mg | INTRAMUSCULAR | Status: DC | PRN

## 2020-10-19 MED ORDER — SODIUM CHLORIDE 0.9% FLUSH
3.0000 mL | INTRAVENOUS | Status: DC | PRN
Start: 2020-10-19 — End: 2020-10-22

## 2020-10-19 MED ORDER — OXYTOCIN 10 UNIT/ML IJ SOLN
INTRAMUSCULAR | Status: AC
  Filled 2020-10-19: qty 2

## 2020-10-19 MED ORDER — LIDOCAINE HCL (PF) 1 % IJ SOLN: 30.0000 mL | INTRAMUSCULAR | Status: DC | PRN

## 2020-10-19 MED ORDER — LACTATED RINGERS IV SOLN
INTRAVENOUS | Status: DC
  Administered 2020-10-20: 1000 mL via INTRAVENOUS

## 2020-10-19 MED ORDER — LABETALOL HCL 5 MG/ML IV SOLN
20.0000 mg | INTRAVENOUS | Status: DC | PRN
  Administered 2020-10-19 – 2020-10-21 (×5): 20 mg via INTRAVENOUS
  Filled 2020-10-19 (×6): qty 4

## 2020-10-19 NOTE — Progress Notes (Signed)
Patient here for MH RV at 39 4/7. Needs IOL referral. C/O numbness in fingertips on right hand.Burt Knack, RN

## 2020-10-19 NOTE — Progress Notes (Signed)
Patient ID: SETAREH ROM, female   DOB: 11-Oct-1995, 25 y.o.   MRN: [id number] Notes and labs reviewed  P/C ratio c/w preeclampsia with severe features given BP s. Continue magnesium and IV antihypertension meds . Cytotec induction

## 2020-10-19 NOTE — Progress Notes (Signed)
   PRENATAL VISIT NOTE  Subjective:  Krista Huffman is a 25 y.o. G1P0000 at [redacted]w[redacted]d being seen today for ongoing prenatal care.  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  Patient reports headache- intermittent starting yesterday. No present  now. Denies changes in vision, RUQ pain, abnormal swelling.  Contractions: Not present. Vag. Bleeding: None.  Movement: Present. Denies leaking of fluid/ROM.   The following portions of the patient's history were reviewed and updated as appropriate: allergies, current medications, past family history, past medical history, past social history, past surgical history and problem list. Problem list updated.  Objective:   Vitals:   10/19/20 1300  BP: (!) 163/90  Pulse: 76  Temp: 97.9 F (36.6 C)  Weight: 276 lb 12.8 oz (125.6 kg)    Fetal Status: Fetal Heart Rate (bpm): 141 Fundal Height: 40 cm Movement: Present  Presentation: Vertex  General:  Alert, oriented and cooperative. Patient is in no acute distress.  Skin: Skin is warm and dry. No rash noted.   Cardiovascular: Normal heart rate noted  Respiratory: Normal respiratory effort, no problems with respiration noted  Abdomen: Soft, gravid, appropriate for gestational age.  Pain/Pressure: Absent     Pelvic: Cervical exam deferred        Extremities: Normal range of motion.  Edema: Trace  Mental Status: Normal mood and affect. Normal behavior. Normal judgment and thought content.   Assessment and Plan:  Pregnancy: G1P0000 at [redacted]w[redacted]d  1. Pre-eclampsia in third trimester BP today  160/93 and then 153/99 Urine spot last week was >300mg  and she here for a short interval BP check  due to her elevated protein.  She likely meets criteria for preeclampsia at this point with known proteinuria and elevated BP, she will need anotehr BP check in ~4 to secure the diagnosis but she meets criteria for gestational HTN regardless and is term  Instructed to proceed to the hospital ASAP.  AS BP was not severe range did not require EMS transport and will be coming in private vehicle. Patient was counseled about high liklihood of IOL for PEC vs GHTN and knows to expect COVID testing on admission.   Paged on call CNM Margaretmary Eddy at 13:45. Sent Epic message as well  2:00 PM spoke with CNM Margaretmary Eddy and gave report. CNM to call LD  BP check in 10d made at [facility] and requested 6 wk pp visit as well  2. Chlamydia 09/28/20 Will ned TOC next week. Took treatment and has not has sex with partner since treatment.   3. Obesity affecting pregnancy, antepartum TWG=46 lb 12.8 oz (21.2 kg) which is well above normal  4. Supervision of high risk pregnancy, antepartum Up to date     Term labor symptoms and general obstetric precautions including but not limited to vaginal bleeding, contractions, leaking of fluid and fetal movement were reviewed in detail with the patient.  Please refer to After Visit Summary for other counseling recommendations.   Return in about 1 week (around 10/26/2020) for bp.  Future Appointments  Date Time Provider Department Center  10/29/2020 10:20 AM [facility] PROVIDER [facility] None    Federico Flake, MD

## 2020-10-19 NOTE — H&P (Signed)
OB History & Physical   History of Present Illness:   Chief Complaint: elevated BP in office  HPI:  Krista Huffman is a 25 y.o. G1P0000 female at [redacted]w[redacted]d dated by [place] at 10.4 weeks, not consistent with LMP.  She presents to L&D for elevated BP's in office. Received report from Dr Alvester Morin. Several severe range blood pressures in office today.  PreE workup neg last week, will direct admit today for labor induction for gestational htn.  Reports active fetal movement  Contractions: denies  LOF/SROM: denies Vaginal bleeding: denies  Pregnancy Issues:  Patient Active Problem List   Diagnosis Date Noted  . Gestational hypertension 10/19/2020  . Chlamydia 09/28/20 10/05/2020  . Obesity (BMI 30.0-34.9) 08/05/2020  . COVID-19 virus infection 06/04/20 06/15/2020  . Obesity affecting pregnancy, antepartum 03/22/2020  . Supervision of high risk pregnancy, antepartum 03/22/2020  . Marijuana use +UDS 03/22/20, 06/15/20, 07/21/20 03/22/2020  . Low-level of literacy completed 9th grade 03/22/2020     Maternal Medical History:   Past Medical History:  Diagnosis Date  . Mononucleosis     Past Surgical History:  Procedure Laterality Date  . denies    . denies surgical history      No Known Allergies  Prior to Admission medications   Medication Sig Start Date End Date Taking? Authorizing Provider  acetaminophen (TYLENOL) 500 MG tablet Take 1,000 mg by mouth every 6 (six) hours as needed for headache.   Yes [provider]  aspirin EC 81 MG tablet Take 1 tablet (81 mg total) by mouth daily. Take after 12 weeks for prevention of preeclampsia later in pregnancy 04/19/20  Yes Federico Flake, MD  Prenatal Vit-Fe Fumarate-FA (PRENATAL MULTIVITAMIN) TABS tablet Take 1 tablet by mouth daily at 12 noon.   Yes [provider]     Prenatal care site:  [facility]  Social History: She  reports that she has never smoked. She has never used smokeless tobacco. She reports previous drug  use. Drug: Marijuana. She reports that she does not drink alcohol.  Family History: family history includes Diabetes in her mother; Heart attack in her maternal grandmother; Ovarian cysts in her mother; Stroke in her maternal grandfather.   Review of Systems: A full review of systems was performed and negative except as noted in the HPI.     Physical Exam:  Vital Signs: BP (!) 149/86   Pulse 83   Temp 98.8 F (37.1 C) (Oral)   Resp 16   Ht 5\' 9"  (1.753 m)   Wt 125.2 kg   LMP 01/03/2020 (Approximate) Comment: normal  BMI 40.76 kg/m  Physical Exam  General: no acute distress.  HEENT: normocephalic, atraumatic Heart: regular rate & rhythm.  No murmurs/rubs/gallops Lungs: clear to auscultation bilaterally, normal respiratory effort Abdomen: soft, gravid, non-tender;  EFW: 7.5lbs Pelvic:   External: Normal external female genitalia  Cervix: Dilation: Closed / Effacement (%): Thick / Station: -3    Extremities: non-tender, symmetric, no edema bilaterally.  DTRs: 2+/2+  Neurologic: Alert & oriented x 3.    Results for orders placed or performed during the hospital encounter of 10/19/20 (from the past 24 hour(s))  Resp Panel by RT-PCR (Flu A&B, Covid) Nasopharyngeal Swab     Status: None   Collection Time: 10/19/20  3:35 PM   Specimen: Nasopharyngeal Swab; Nasopharyngeal(NP) swabs in vial transport medium  Result Value Ref Range   SARS Coronavirus 2 by RT PCR NEGATIVE NEGATIVE   Influenza A by PCR NEGATIVE NEGATIVE  Influenza B by PCR NEGATIVE NEGATIVE  CBC     Status: Abnormal   Collection Time: 10/19/20  3:35 PM  Result Value Ref Range   WBC 11.9 (H) 4.0 - 10.5 K/uL   RBC 4.19 3.87 - 5.11 MIL/uL   Hemoglobin 12.0 12.0 - 15.0 g/dL   HCT 96.2 (L) 95.2 - 84.1 %   MCV 81.6 80.0 - 100.0 fL   MCH 28.6 26.0 - 34.0 pg   MCHC 35.1 30.0 - 36.0 g/dL   RDW 32.4 40.1 - 02.7 %   Platelets 329 150 - 400 K/uL   nRBC 0.0 0.0 - 0.2 %  Type and screen     Status: None   Collection Time:  10/19/20  3:35 PM  Result Value Ref Range   ABO/RH(D) O POS    Antibody Screen NEG    Sample Expiration      10/22/2020,2359 Performed at [facility], [address] [place], [place] [address]   Comprehensive metabolic panel     Status: Abnormal   Collection Time: 10/19/20  3:35 PM  Result Value Ref Range   Sodium 137 135 - 145 mmol/L   Potassium 3.6 3.5 - 5.1 mmol/L   Chloride 107 98 - 111 mmol/L   CO2 20 (L) 22 - 32 mmol/L   Glucose, Bld 80 70 - 99 mg/dL   BUN 7 6 - 20 mg/dL   Creatinine, Ser 4.40 0.44 - 1.00 mg/dL   Calcium 9.2 8.9 - 34.7 mg/dL   Total Protein 7.1 6.5 - 8.1 g/dL   Albumin 3.0 (L) 3.5 - 5.0 g/dL   AST 24 15 - 41 U/L   ALT 16 0 - 44 U/L   Alkaline Phosphatase 124 38 - 126 U/L   Total Bilirubin 0.6 0.3 - 1.2 mg/dL   GFR, Estimated >42 >59 mL/min   Anion gap 10 5 - 15  Protein / creatinine ratio, urine     Status: Abnormal   Collection Time: 10/19/20  3:35 PM  Result Value Ref Range   Creatinine, Urine 150 mg/dL   Total Protein, Urine 104 mg/dL   Protein Creatinine Ratio 0.69 (H) 0.00 - 0.15 mg/mg[Cre]    Pertinent Results:  Prenatal Labs: Blood type/Rh O+  Antibody screen neg  Rubella Pending  Varicella Pending  RPR NR  HBsAg Neg  HIV NR  GC neg  Chlamydia Positive/treated with Azith  Genetic screening negative  1 hour GTT 114  3 hour GTT N/A  GBS neg   FHT:  FHR: 135 bpm, variability: moderate,  accelerations:  Present,  decelerations:  Absent Category/reactivity:  Category I UC:   none   Cephalic by Leopolds and SVE   No results found.  Assessment:  Krista Huffman is a 25 y.o. G5P0000 female at [redacted]w[redacted]d with gestational htn.   Plan:  1. Admit to Labor & Delivery; consents reviewed and obtained - Covid admission screen  - Dr. Feliberto Gottron notified of admission, reviewed VS, interventions for severe range BP - Will start on Mag Sulfate for seizure prevention d/t severe range BP's  2. Fetal Well being  - Fetal Tracing: Cat  1 - Group B Streptococcus ppx not indicated: GBS neg - Presentation: cephalic confirmed by sve   3. Routine OB: - Prenatal labs reviewed, as above - Rh positive - CBC, T&S, RPR on admit - reg diet, IVF - preE labs pending  4. Induction of labor  - Contractions monitored with external toco - Pelvis adequate for trial  of labor  - Plan for induction with misoprostol  - Induction with oxytocin, AROM and cervical balloon as appropriate  - Plan for  continuous fetal monitoring - Maternal pain control as desired; planning regional anesthesia - Anticipate vaginal delivery  5. Post Partum Planning: - Infant feeding: breast - Contraception: Depo - Tdap vaccine: given 08/05/20 - Flu vaccine: 03/22/20  Chari Manning CNM  Gustavo Lah, CNM 10/19/20 4:58 PM  Margaretmary Eddy, CNM Certified Nurse Midwife [facility]

## 2020-10-19 NOTE — Progress Notes (Signed)
BP recheck 153/99, reviewed by provider.Burt Knack, RN

## 2020-10-20 LAB — MAGNESIUM: Magnesium: 4.3 mg/dL — ABNORMAL HIGH (ref 1.7–2.4)

## 2020-10-20 LAB — RPR: RPR Ser Ql: NONREACTIVE

## 2020-10-20 MED ORDER — LABETALOL HCL 5 MG/ML IV SOLN
INTRAVENOUS | Status: AC
  Filled 2020-10-20: qty 4

## 2020-10-20 MED ORDER — BUTALBITAL-APAP-CAFFEINE 50-325-40 MG PO TABS
1.0000 | ORAL_TABLET | Freq: Once | ORAL | Status: AC
  Administered 2020-10-20: 1 via ORAL
  Filled 2020-10-20: qty 1

## 2020-10-20 NOTE — Progress Notes (Signed)
Labor Progress Note  Krista Huffman is a 25 y.o. G1P0000 at [redacted]w[redacted]d by ultrasound admitted for induction of labor due to Pre-eclamptic toxemia of pregnancy..  Subjective: Assumed care. Pt c/o HA that Tylenol did not resolve.  Will give pt a coke for some caffeine to see if that helps.   Objective: BP (!) 151/94   Pulse 77   Temp 97.9 F (36.6 C) (Oral)   Resp 18   Ht 5\' 9"  (1.753 m)   Wt 125.2 kg   LMP 01/03/2020 (Approximate) Comment: normal  SpO2 97%   BMI 40.76 kg/m   Fetal Assessment: FHT:  FHR: 130 bpm, variability: moderate,  accelerations:  Present,  decelerations:  Absent Category/reactivity:  Category I UC:   none SVE:    Dilation: 1.5 cm  Effacement: 70%  Station:  -3  Consistency: medium  Position: posterior  Membrane status:intact Amniotic color: n/a  Labs: Lab Results  Component Value Date   WBC 11.9 (H) 10/19/2020   HGB 12.0 10/19/2020   HCT 34.2 (L) 10/19/2020   MCV 81.6 10/19/2020   PLT 329 10/19/2020    Assessment / Plan: Induction of labor due to preeclampsia,  S/p cytotec x4 - last dose at 0455  Labor: Progressing normally Preeclampsia:  151/94, on Mag 2g infusion going, I&O +207.6, HA Fetal Wellbeing:  Category I Pain Control:  Labor support without medications I/D:  afebrile, GBS neg, Intact Anticipated MOD:  NSVD  10/21/2020, CNM 10/20/2020, 9:08 AM

## 2020-10-20 NOTE — Addendum Note (Signed)
Addended by: Heywood Bene on: 10/20/2020 02:42 PM   Modules accepted: Orders

## 2020-10-20 NOTE — Progress Notes (Signed)
Labor Progress Note  Krista Huffman is a 25 y.o. G1P0000 at [redacted]w[redacted]d by ultrasound admitted for induction of labor due to Pre-eclamptic toxemia of pregnancy.  Subjective: Pt is comfortable   Objective: BP (!) 157/90   Pulse 76   Temp 98 F (36.7 C) (Oral)   Resp 20   Ht 5\' 9"  (1.753 m)   Wt 125.2 kg   LMP 01/03/2020 (Approximate) Comment: normal  SpO2 96%   BMI 40.76 kg/m   Fetal Assessment: FHT:  FHR: 130 bpm, variability: moderate,  accelerations:  Present,  decelerations:  Absent Category/reactivity:  Category I UC:   none SVE:    Dilation: 1.5 cm  Effacement: 70%  Station:  -3  Consistency: medium  Position: posterior  Membrane status:intact Amniotic color: n/a  Labs: Lab Results  Component Value Date   WBC 11.9 (H) 10/19/2020   HGB 12.0 10/19/2020   HCT 34.2 (L) 10/19/2020   MCV 81.6 10/19/2020   PLT 329 10/19/2020    Assessment / Plan: Induction of labor due to preeclampsia,  Pitocin started at 1100  Labor: Progressing normally Pit at 71mU - turned off and Tums given Attempted to place a foley catheter, was unable, pt tolerated procedure well. Preeclampsia:  151/94, on Mag 2g infusion going, I&O +207.6, HA resolving Fetal Wellbeing:  Category I Pain Control:  Labor support without medications I/D:  afebrile, GBS neg, Intact Anticipated MOD:  NSVD  30m, CNM 10/20/2020, 6:23 PM

## 2020-10-21 ENCOUNTER — Inpatient Hospital Stay: Payer: Medicaid Other | Admitting: Anesthesiology

## 2020-10-21 ENCOUNTER — Other Ambulatory Visit: Payer: Self-pay

## 2020-10-21 ENCOUNTER — Encounter: Payer: Self-pay | Admitting: Obstetrics and Gynecology

## 2020-10-21 LAB — 789231 7+OXYCODONE-BUND
Amphetamines, Urine: NEGATIVE ng/mL
BENZODIAZ UR QL: NEGATIVE ng/mL
Barbiturate screen, urine: NEGATIVE ng/mL
Cocaine (Metab.): NEGATIVE ng/mL
OPIATE SCREEN URINE: NEGATIVE ng/mL
Oxycodone/Oxymorphone, Urine: NEGATIVE ng/mL
PCP Quant, Ur: NEGATIVE ng/mL

## 2020-10-21 LAB — CBC
HCT: 32.6 % — ABNORMAL LOW (ref 36.0–46.0)
Hemoglobin: 11.3 g/dL — ABNORMAL LOW (ref 12.0–15.0)
MCH: 28.5 pg (ref 26.0–34.0)
MCHC: 34.7 g/dL (ref 30.0–36.0)
MCV: 82.3 fL (ref 80.0–100.0)
Platelets: 316 10*3/uL (ref 150–400)
RBC: 3.96 MIL/uL (ref 3.87–5.11)
RDW: 12.9 % (ref 11.5–15.5)
WBC: 9.8 10*3/uL (ref 4.0–10.5)
nRBC: 0 % (ref 0.0–0.2)

## 2020-10-21 LAB — COMPREHENSIVE METABOLIC PANEL
ALT: 15 U/L (ref 0–44)
AST: 29 U/L (ref 15–41)
Albumin: 2.7 g/dL — ABNORMAL LOW (ref 3.5–5.0)
Alkaline Phosphatase: 125 U/L (ref 38–126)
Anion gap: 13 (ref 5–15)
BUN: 6 mg/dL (ref 6–20)
CO2: 19 mmol/L — ABNORMAL LOW (ref 22–32)
Calcium: 8.4 mg/dL — ABNORMAL LOW (ref 8.9–10.3)
Chloride: 103 mmol/L (ref 98–111)
Creatinine, Ser: 0.63 mg/dL (ref 0.44–1.00)
GFR, Estimated: >60 mL/min (ref >60)
Glucose, Bld: 123 mg/dL — ABNORMAL HIGH (ref 70–99)
Potassium: 3.3 mmol/L — ABNORMAL LOW (ref 3.5–5.1)
Sodium: 135 mmol/L (ref 135–145)
Total Bilirubin: 0.6 mg/dL (ref 0.3–1.2)
Total Protein: 6.6 g/dL (ref 6.5–8.1)

## 2020-10-21 LAB — CANNABINOID CONFIRMATION, UR
CANNABINOIDS: POSITIVE — AB
Carboxy THC GC/MS Conf: 283 ng/mL

## 2020-10-21 LAB — PROTEIN / CREATININE RATIO, URINE
Creatinine, Urine: 161 mg/dL
Protein, Ur: 24.3 mg/dL
Protein/Creat Ratio: 151 mg/g creat (ref 0–200)

## 2020-10-21 LAB — MAGNESIUM: Magnesium: 4 mg/dL — ABNORMAL HIGH (ref 1.7–2.4)

## 2020-10-21 MED ORDER — LIDOCAINE HCL (PF) 1 % IJ SOLN
INTRAMUSCULAR | Status: DC | PRN
  Administered 2020-10-21: 3 mL via SUBCUTANEOUS

## 2020-10-21 MED ORDER — SODIUM CHLORIDE 0.9 % IV SOLN
2.0000 g | Freq: Four times a day (QID) | INTRAVENOUS | Status: DC
  Filled 2020-10-21: qty 2000

## 2020-10-21 MED ORDER — POTASSIUM CHLORIDE CRYS ER 10 MEQ PO TBCR
20.0000 meq | EXTENDED_RELEASE_TABLET | Freq: Two times a day (BID) | ORAL | Status: DC
  Administered 2020-10-21 – 2020-10-22 (×2): 20 meq via ORAL
  Filled 2020-10-21 (×4): qty 2

## 2020-10-21 MED ORDER — SODIUM CHLORIDE 0.9 % IV SOLN
INTRAVENOUS | Status: AC
  Administered 2020-10-21: 2 g via INTRAVENOUS
  Filled 2020-10-21: qty 2000

## 2020-10-21 MED ORDER — EPHEDRINE 5 MG/ML INJ: 10.0000 mg | INTRAVENOUS | Status: DC | PRN

## 2020-10-21 MED ORDER — PHENYLEPHRINE 40 MCG/ML (10ML) SYRINGE FOR IV PUSH (FOR BLOOD PRESSURE SUPPORT): 80.0000 ug | PREFILLED_SYRINGE | INTRAVENOUS | Status: DC | PRN

## 2020-10-21 MED ORDER — FENTANYL 2.5 MCG/ML W/ROPIVACAINE 0.15% IN NS 100 ML EPIDURAL (ARMC)
EPIDURAL | Status: DC | PRN
  Administered 2020-10-21: 12 mL/h via EPIDURAL

## 2020-10-21 MED ORDER — FENTANYL 2.5 MCG/ML W/ROPIVACAINE 0.15% IN NS 100 ML EPIDURAL (ARMC)
EPIDURAL | Status: AC
  Filled 2020-10-21: qty 100

## 2020-10-21 MED ORDER — DIPHENHYDRAMINE HCL 50 MG/ML IJ SOLN: 12.5000 mg | INTRAMUSCULAR | Status: DC | PRN

## 2020-10-21 MED ORDER — LIDOCAINE-EPINEPHRINE (PF) 1.5 %-1:200000 IJ SOLN
INTRAMUSCULAR | Status: DC | PRN
  Administered 2020-10-21: 4 mL via EPIDURAL

## 2020-10-21 MED ORDER — FENTANYL 2.5 MCG/ML W/ROPIVACAINE 0.15% IN NS 100 ML EPIDURAL (ARMC)
12.0000 mL/h | EPIDURAL | Status: DC
  Administered 2020-10-22 (×2): 12 mL/h via EPIDURAL
  Filled 2020-10-21 (×2): qty 100

## 2020-10-21 MED ORDER — LACTATED RINGERS IV SOLN: 500.0000 mL | Freq: Once | INTRAVENOUS | Status: DC

## 2020-10-21 MED ORDER — BUPIVACAINE HCL (PF) 0.25 % IJ SOLN
INTRAMUSCULAR | Status: DC | PRN
  Administered 2020-10-21 (×2): 5 mL via EPIDURAL

## 2020-10-21 MED ORDER — GENTAMICIN SULFATE 40 MG/ML IJ SOLN
5.0000 mg/kg | INTRAVENOUS | Status: DC
  Administered 2020-10-21 – 2020-10-23 (×2): 450 mg via INTRAVENOUS
  Filled 2020-10-21 (×4): qty 11.25

## 2020-10-21 NOTE — Progress Notes (Signed)
Labor Progress Note  Krista Huffman is a 25 y.o. G1P0000 at [redacted]w[redacted]d by ultrasound admitted for Pre-E with severe features by severe range BP.   Subjective: feeling mild cramping. Denies HA, VD or RUQ pain.   Objective: BP (!) 155/89   Pulse 80   Temp 97.9 F (36.6 C) (Oral)   Resp 16   Ht 5\' 9"  (1.753 m)   Wt 125.2 kg   LMP 01/03/2020 (Approximate) Comment: normal  SpO2 96%   BMI 40.76 kg/m  Notable VS details: s/p tx with IV Labetalol around 1030  Pulm: Lungs CTAB Card: RRR, no murmur Extrem: bilat 1+ edema, 2+ DTRs, no clonus  Fetal Assessment: FHT:  FHR: 120 bpm, variability: moderate,  accelerations:  Present,  decelerations:  Absent Category/reactivity:  Category I UC:   Irregular, occasional.  SVE:  3cm/80 with Cook cath in place.    Membrane status: intact Amniotic color: n/a  Labs: Lab Results  Component Value Date   WBC 9.8 10/21/2020   HGB 11.3 (L) 10/21/2020   HCT 32.6 (L) 10/21/2020   MCV 82.3 10/21/2020   PLT 316 10/21/2020    Component     Latest Ref Rng & Units 10/21/2020  Sodium     135 - 145 mmol/L 135  Potassium     3.5 - 5.1 mmol/L 3.3 (L)  Chloride     98 - 111 mmol/L 103  CO2     22 - 32 mmol/L 19 (L)  Glucose     70 - 99 mg/dL 10/23/2020 (H)  BUN     6 - 20 mg/dL 6  Creatinine     482 - 1.00 mg/dL 7.07  Calcium     8.9 - 10.3 mg/dL 8.4 (L)  Total Protein     6.5 - 8.1 g/dL 6.6  Albumin     3.5 - 5.0 g/dL 2.7 (L)  AST     15 - 41 U/L 29  ALT     0 - 44 U/L 15  Alkaline Phosphatase     38 - 126 U/L 125  Total Bilirubin     0.3 - 1.2 mg/dL 0.6  GFR, Estimated     >60 mL/min >60  Anion gap     5 - 15 13  Magnesium     1.7 - 2.4 mg/dL 4.0 (H)    Assessment / Plan: IOL for Pre-E with severe features.   Labor: s/p ripening with cytotec 4 doses, then Pitocin on 5/25 to max 20 mu/min, off for several hours, then restart overnight at low dose with max 58mu/min. PItocin break x 4hrs, Cook Cath placed. Restarted Pitocin, will AROM  once cook cath out.   Preeclampsia:  on magnesium sulfate, no signs or symptoms of toxicity, intake and ouput balanced and labs stable; repeat Labs this am WNL, incentive spirometry discussed and initiated.   Hypokalemia: plan to replace with PO potassium due to K+ 3.3  Fetal Wellbeing:  Category I Pain Control:  Labor support without medications I/D:  n/a  Updated Dr 11m regarding POC.   Feliberto Gottron, CNM 10/21/2020, 2:06 PM

## 2020-10-21 NOTE — Addendum Note (Signed)
Addended by: Heywood Bene on: 10/21/2020 10:34 AM   Modules accepted: Orders

## 2020-10-21 NOTE — Anesthesia Procedure Notes (Signed)
Epidural Patient location during procedure: OB Start time: 10/21/2020 6:57 PM End time: 10/21/2020 7:26 PM  Staffing Anesthesiologist: Yves Dill, MD Resident/CRNA: Irving Burton, CRNA Performed: resident/CRNA   Preanesthetic Checklist Completed: patient identified, IV checked, site marked, risks and benefits discussed, surgical consent, monitors and equipment checked, pre-op evaluation and timeout performed  Epidural Patient position: sitting Prep: ChloraPrep Patient monitoring: heart rate, continuous pulse ox and blood pressure Approach: midline Location: L3-L4 Injection technique: LOR saline  Needle:  Needle type: Tuohy  Needle gauge: 17 G Needle length: 9 cm and 9 Needle insertion depth: 8 cm Catheter type: closed end flexible Catheter size: 19 Gauge Catheter at skin depth: 13 cm Test dose: negative and 1.5% lidocaine with Epi 1:200 K  Assessment Sensory level: T10 Events: blood not aspirated, injection not painful, no injection resistance, no paresthesia and negative IV test  Additional Notes 1 attempt Pt. Evaluated and documentation done after procedure finished. Patient identified. Risks/Benefits/Options discussed with patient including but not limited to bleeding, infection, nerve damage, paralysis, failed block, incomplete pain control, headache, blood pressure changes, nausea, vomiting, reactions to medication both or allergic, itching and postpartum back pain. Confirmed with bedside nurse the patient's most recent platelet count. Confirmed with patient that they are not currently taking any anticoagulation, have any bleeding history or any family history of bleeding disorders. Patient expressed understanding and wished to proceed. All questions were answered. Sterile technique was used throughout the entire procedure. Please see nursing notes for vital signs. Test dose was given through epidural catheter and negative prior to continuing to dose epidural or start  infusion. Warning signs of high block given to the patient including shortness of breath, tingling/numbness in hands, complete motor block, or any concerning symptoms with instructions to call for help. Patient was given instructions on fall risk and not to get out of bed. All questions and concerns addressed with instructions to call with any issues or inadequate analgesia.   Patient tolerated the insertion well without immediate complications.Reason for block:procedure for pain

## 2020-10-21 NOTE — Progress Notes (Signed)
Labor Progress Note  Krista Huffman is a 25 y.o. G1P0000 at [redacted]w[redacted]d by ultrasound admitted for Pre-E with severe features by severe range BP.   Subjective: mild cough intermittently, no acute SOB, no chest pain.  Denies HA, VD or RUQ pain.   Objective: BP (!) 119/59   Pulse 86   Temp 99.9 F (37.7 C) (Oral)   Resp (!) 26   Ht 5\' 9"  (1.753 m)   Wt 125.2 kg   LMP 01/03/2020 (Approximate) Comment: normal  SpO2 96%   BMI 40.76 kg/m  Notable VS details: s/p tx with IV Labetalol 20 then 40mg , last at 1044  Pulm: Lungs CTAB Card: RRR, no murmur Extrem: bilat 1+ edema, 2+ DTRs, no clonus  Fetal Assessment: FHT:  FHR: 130 bpm, variability: moderate,  accelerations:  Present,  decelerations:  Present variable noted after AROM.  Category/reactivity:  Category I UC:  q2-3.5 min, Pitocin at 67mu/min, adequate MVUs SVE: 6/80/-1   Membrane status: AROM at 1648 Amniotic color: light meconium    Assessment / Plan: IOL for Pre-E with severe features.   Labor: s/p ripening with cytotec 4 doses, then Pitocin on 5/25 to max 20 mu/min, off for several hours, then restart overnight at low dose with max 80mu/min. PItocin break x 4hrs, Cook Cath placed. PItocin titrated, AROM performed. Plan to titrate Pit up to max 85mu/min - discussed timing of CS for failed IOL if no cervical change, plan for 24hrs on Pitocin or 18hrs after AROM.   Preeclampsia:  on magnesium sulfate, no signs or symptoms of toxicity, intake and ouput balanced and labs stable; repeat Labs this am WNL, incentive spirometry initiated.    Hypokalemia: replace with PO potassium due to K+ 3.3, Kdur 11m BID, plan repeat labs in AM.   Fetal Wellbeing:  Category II- recurrent early and variable decels now.  Pain Control:  Labor support without medications I/D:  Fever- axillary temp 99.9 after tylenol, FHR baseline change to 145bpm, Amp and Gent ordered.   Updated Dr 43m  regarding POC.   , CNM 10/21/2020, 10:10  PM

## 2020-10-21 NOTE — Progress Notes (Signed)
Labor Progress Note  Krista Huffman is a 25 y.o. G1P0000 at [redacted]w[redacted]d by ultrasound admitted for Pre-E with severe features by severe range BP.   Subjective: feeling no pain or cramping. Denies HA, VD or RUQ pain.   Objective: BP (!) 157/108   Pulse 68   Temp 98.6 F (37 C) (Oral)   Resp 20   Ht 5\' 9"  (1.753 m)   Wt 125.2 kg   LMP 01/03/2020 (Approximate) Comment: normal  SpO2 98%   BMI 40.76 kg/m  Notable VS details: reviewed- repeat BP 147/78 per nursing  Pulm: Lungs CTAB Card: RRR, no murmur Extrem: bilat 1+ edema, 2+ DTRs, no clonus  Fetal Assessment: FHT:  FHR: 120 bpm, variability: moderate,  accelerations:  Present,  decelerations:  Absent Category/reactivity:  Category I UC:   Irregular, occasional.  SVE:   1.5/50/-2; cx soft, midposition, slightly displaced to maternal right.  - Cook Cath Placed with 80 ml uterine/75ml vaginal. Scant amt bloody show noted.   Membrane status: intact Amniotic color: n/a  Labs: Lab Results  Component Value Date   WBC 11.9 (H) 10/19/2020   HGB 12.0 10/19/2020   HCT 34.2 (L) 10/19/2020   MCV 81.6 10/19/2020   PLT 329 10/19/2020    Assessment / Plan: IOL for Pre-E with severe features.   Labor: s/p ripening with cytotec 4 doses, then Pitocin on 5/25 to max 20 mu/min, off for several hours, then restart overnight at low dose with max 4mu/min. Now Pitocin off, Cook Cath placed. Will restart Pitocin after 4hr break, plan AROM.  Preeclampsia:  on magnesium sulfate, no signs or symptoms of toxicity, intake and ouput balanced and labs stable; repeat Labs this am WNL, incentive spirometry discussed and initiated.  Fetal Wellbeing:  Category I Pain Control:  Labor support without medications I/D:  n/a Updated Dr 11m regarding POC.   Krista Huffman, CNM 10/21/2020, 8:03 AM

## 2020-10-21 NOTE — Anesthesia Preprocedure Evaluation (Signed)
Anesthesia Evaluation  Patient identified by MRN, date of birth, ID band Patient awake    Reviewed: Allergy & Precautions, H&P , NPO status , Patient's Chart, lab work & pertinent test results  Airway Mallampati: III       Dental no notable dental hx.    Pulmonary           Cardiovascular hypertension,      Neuro/Psych    GI/Hepatic   Endo/Other    Renal/GU      Musculoskeletal   Abdominal   Peds  Hematology   Anesthesia Other Findings   Reproductive/Obstetrics (+) Pregnancy                             Anesthesia Physical Anesthesia Plan  ASA: III  Anesthesia Plan: Epidural   Post-op Pain Management:    Induction:   PONV Risk Score and Plan:   Airway Management Planned:   Additional Equipment:   Intra-op Plan:   Post-operative Plan:   Informed Consent: I have reviewed the patients History and Physical, chart, labs and discussed the procedure including the risks, benefits and alternatives for the proposed anesthesia with the patient or authorized representative who has indicated his/her understanding and acceptance.       Plan Discussed with: CRNA  Anesthesia Plan Comments:         Anesthesia Quick Evaluation

## 2020-10-21 NOTE — Progress Notes (Signed)
Labor Progress Note  Krista Huffman is a 25 y.o. G1P0000 at [redacted]w[redacted]d by ultrasound admitted for Pre-E with severe features by severe range BP.   Subjective: feeling mild cramping. Denies HA, VD or RUQ pain.   Objective: BP (!) 154/89   Pulse 81   Temp 98.7 F (37.1 C) (Oral)   Resp 17   Ht 5\' 9"  (1.753 m)   Wt 125.2 kg   LMP 01/03/2020 (Approximate) Comment: normal  SpO2 98%   BMI 40.76 kg/m  Notable VS details: s/p tx with IV Labetalol 20 then 40mg , last at 1044  Pulm: Lungs CTAB Card: RRR, no murmur Extrem: bilat 1+ edema, 2+ DTRs, no clonus  Fetal Assessment: FHT:  FHR: 130 bpm, variability: moderate,  accelerations:  Present,  decelerations:  Present variable noted after AROM.  Category/reactivity:  Category I UC:  q52min, Pitocin at 65mu/min SVE: 5/75/-1, AROM  - IUPC and FSE placed.  Membrane status: AROM at 1648 Amniotic color: light meconium    Assessment / Plan: IOL for Pre-E with severe features.   Labor: s/p ripening with cytotec 4 doses, then Pitocin on 5/25 to max 20 mu/min, off for several hours, then restart overnight at low dose with max 74mu/min. PItocin break x 4hrs, Cook Cath placed. PItocin titrated, AROM performed. Plan to titrate Pit up to max 66mu/min - discussed timing of CS for failed IOL if no cervical change, plan for 24hrs on Pitocin or 18hrs after AROM.  - currently reassuring fetal monitoring.    Preeclampsia:  on magnesium sulfate, no signs or symptoms of toxicity, intake and ouput balanced and labs stable; repeat Labs this am WNL, incentive spirometry initiated.   Hypokalemia: replace with PO potassium due to K+ 3.3, Kdur 11m BID, repeat labs in AM.   Fetal Wellbeing:  Category I Pain Control:  Labor support without medications I/D:  n/a  Updated Dr 43m and Dr  regarding POC.   Feliberto Gottron, CNM 10/21/2020, 5:32 PM

## 2020-10-22 ENCOUNTER — Encounter: Payer: Self-pay | Admitting: Obstetrics and Gynecology

## 2020-10-22 ENCOUNTER — Encounter: Admission: EM | Disposition: A | Discharge status: Home / Self Care | Payer: Self-pay | Source: Home / Self Care

## 2020-10-22 DIAGNOSIS — O0993 Supervision of high risk pregnancy, unspecified, third trimester: Secondary | ICD-10-CM

## 2020-10-22 LAB — CBC
HCT: 34.3 % — ABNORMAL LOW (ref 36.0–46.0)
Hemoglobin: 11.8 g/dL — ABNORMAL LOW (ref 12.0–15.0)
MCH: 28.6 pg (ref 26.0–34.0)
MCHC: 34.4 g/dL (ref 30.0–36.0)
MCV: 83.1 fL (ref 80.0–100.0)
Platelets: 303 10*3/uL (ref 150–400)
RBC: 4.13 MIL/uL (ref 3.87–5.11)
RDW: 12.9 % (ref 11.5–15.5)
WBC: 14.8 10*3/uL — ABNORMAL HIGH (ref 4.0–10.5)
nRBC: 0 % (ref 0.0–0.2)

## 2020-10-22 LAB — COMPREHENSIVE METABOLIC PANEL
ALT: 19 U/L (ref 0–44)
AST: 29 U/L (ref 15–41)
Albumin: 2.6 g/dL — ABNORMAL LOW (ref 3.5–5.0)
Alkaline Phosphatase: 140 U/L — ABNORMAL HIGH (ref 38–126)
Anion gap: 12 (ref 5–15)
BUN: 6 mg/dL (ref 6–20)
CO2: 18 mmol/L — ABNORMAL LOW (ref 22–32)
Calcium: 8.3 mg/dL — ABNORMAL LOW (ref 8.9–10.3)
Chloride: 103 mmol/L (ref 98–111)
Creatinine, Ser: 0.64 mg/dL (ref 0.44–1.00)
GFR, Estimated: >60 mL/min (ref >60)
Glucose, Bld: 93 mg/dL (ref 70–99)
Potassium: 3.3 mmol/L — ABNORMAL LOW (ref 3.5–5.1)
Sodium: 133 mmol/L — ABNORMAL LOW (ref 135–145)
Total Bilirubin: 1.2 mg/dL (ref 0.3–1.2)
Total Protein: 6.2 g/dL — ABNORMAL LOW (ref 6.5–8.1)

## 2020-10-22 LAB — CBC WITH DIFFERENTIAL/PLATELET
Abs Immature Granulocytes: 0.1 10*3/uL — ABNORMAL HIGH (ref 0.00–0.07)
Basophils Absolute: 0.1 10*3/uL (ref 0.0–0.1)
Basophils Relative: 0 %
Eosinophils Absolute: 0 10*3/uL (ref 0.0–0.5)
Eosinophils Relative: 0 %
HCT: 26.3 % — ABNORMAL LOW (ref 36.0–46.0)
Hemoglobin: 9.1 g/dL — ABNORMAL LOW (ref 12.0–15.0)
Immature Granulocytes: 1 %
Lymphocytes Relative: 9 %
Lymphs Abs: 1.8 10*3/uL (ref 0.7–4.0)
MCH: 28.9 pg (ref 26.0–34.0)
MCHC: 34.6 g/dL (ref 30.0–36.0)
MCV: 83.5 fL (ref 80.0–100.0)
Monocytes Absolute: 1.1 10*3/uL — ABNORMAL HIGH (ref 0.1–1.0)
Monocytes Relative: 5 %
Neutro Abs: 17.5 10*3/uL — ABNORMAL HIGH (ref 1.7–7.7)
Neutrophils Relative %: 85 %
Platelets: 337 10*3/uL (ref 150–400)
RBC: 3.15 MIL/uL — ABNORMAL LOW (ref 3.87–5.11)
RDW: 13.2 % (ref 11.5–15.5)
WBC: 20.5 10*3/uL — ABNORMAL HIGH (ref 4.0–10.5)
nRBC: 0 % (ref 0.0–0.2)

## 2020-10-22 LAB — MEASLES/MUMPS/RUBELLA IMMUNITY
Mumps IgG: <9 AU/mL — ABNORMAL LOW (ref >10.9)
Rubella: 1.15 index (ref >0.99)
Rubeola IgG: 81.9 AU/mL (ref >16.4)

## 2020-10-22 LAB — VARICELLA ZOSTER ANTIBODY, IGG: Varicella IgG: <135 index — ABNORMAL LOW (ref >165)

## 2020-10-22 SURGERY — Surgical Case
Anesthesia: Epidural

## 2020-10-22 MED ORDER — DIPHENHYDRAMINE HCL 50 MG/ML IJ SOLN: 12.5000 mg | INTRAMUSCULAR | Status: DC | PRN

## 2020-10-22 MED ORDER — COCONUT OIL OIL
1.0000 "application " | TOPICAL_OIL | Status: DC | PRN
  Filled 2020-10-22: qty 120

## 2020-10-22 MED ORDER — TRANEXAMIC ACID-NACL 1000-0.7 MG/100ML-% IV SOLN
1000.0000 mg | INTRAVENOUS | Status: AC
  Administered 2020-10-22: 1000 mg via INTRAVENOUS

## 2020-10-22 MED ORDER — ONDANSETRON HCL 4 MG/2ML IJ SOLN: 4.0000 mg | Freq: Three times a day (TID) | INTRAMUSCULAR | Status: DC | PRN

## 2020-10-22 MED ORDER — LIDOCAINE HCL (PF) 2 % IJ SOLN
INTRAMUSCULAR | Status: DC | PRN
  Administered 2020-10-22 (×2): 200 mg via EPIDURAL
  Administered 2020-10-22: 100 mg via EPIDURAL

## 2020-10-22 MED ORDER — SENNOSIDES-DOCUSATE SODIUM 8.6-50 MG PO TABS
2.0000 | ORAL_TABLET | Freq: Every day | ORAL | Status: DC
  Administered 2020-10-23 – 2020-10-24 (×2): 2 via ORAL
  Filled 2020-10-22 (×2): qty 2

## 2020-10-22 MED ORDER — OXYCODONE HCL 5 MG PO TABS
5.0000 mg | ORAL_TABLET | ORAL | Status: DC | PRN
Start: 2020-10-22 — End: 2020-10-24
  Administered 2020-10-23 – 2020-10-24 (×3): 10 mg via ORAL
  Administered 2020-10-24: 5 mg via ORAL
  Filled 2020-10-22 (×3): qty 2

## 2020-10-22 MED ORDER — BUPIVACAINE LIPOSOME 1.3 % IJ SUSP
INTRAMUSCULAR | Status: AC
  Filled 2020-10-22: qty 20

## 2020-10-22 MED ORDER — KETOROLAC TROMETHAMINE 30 MG/ML IJ SOLN: 30.0000 mg | Freq: Four times a day (QID) | INTRAMUSCULAR | Status: DC

## 2020-10-22 MED ORDER — PHENYLEPHRINE HCL (PRESSORS) 10 MG/ML IV SOLN
INTRAVENOUS | Status: DC | PRN
  Administered 2020-10-22: 100 ug via INTRAVENOUS

## 2020-10-22 MED ORDER — FENTANYL CITRATE (PF) 100 MCG/2ML IJ SOLN
INTRAMUSCULAR | Status: AC
  Filled 2020-10-22: qty 2

## 2020-10-22 MED ORDER — ENOXAPARIN SODIUM 60 MG/0.6ML IJ SOSY
60.0000 mg | PREFILLED_SYRINGE | INTRAMUSCULAR | Status: DC
  Filled 2020-10-22: qty 0.6

## 2020-10-22 MED ORDER — BUPIVACAINE HCL (PF) 0.5 % IJ SOLN
INTRAMUSCULAR | Status: AC
  Filled 2020-10-22: qty 30

## 2020-10-22 MED ORDER — KETOROLAC TROMETHAMINE 30 MG/ML IJ SOLN
INTRAMUSCULAR | Status: DC | PRN
  Administered 2020-10-22: 30 mg via INTRAVENOUS

## 2020-10-22 MED ORDER — CEFAZOLIN IN SODIUM CHLORIDE 3-0.9 GM/100ML-% IV SOLN
3.0000 g | INTRAVENOUS | Status: DC
  Filled 2020-10-22: qty 100

## 2020-10-22 MED ORDER — MORPHINE SULFATE (PF) 2 MG/ML IV SOLN: 1.0000 mg | INTRAVENOUS | Status: DC | PRN

## 2020-10-22 MED ORDER — ACETAMINOPHEN 10 MG/ML IV SOLN
INTRAVENOUS | Status: DC | PRN
  Administered 2020-10-22: 1000 mg via INTRAVENOUS

## 2020-10-22 MED ORDER — LACTATED RINGERS IV BOLUS
500.0000 mL | Freq: Once | INTRAVENOUS | Status: AC
  Administered 2020-10-22: 500 mL via INTRAVENOUS

## 2020-10-22 MED ORDER — ONDANSETRON HCL 4 MG/2ML IJ SOLN
INTRAMUSCULAR | Status: AC
  Filled 2020-10-22: qty 2

## 2020-10-22 MED ORDER — MORPHINE SULFATE (PF) 0.5 MG/ML IJ SOLN
INTRAMUSCULAR | Status: DC | PRN
  Administered 2020-10-22: 2 mg via EPIDURAL
  Administered 2020-10-22: 1 mg via EPIDURAL

## 2020-10-22 MED ORDER — MAGNESIUM SULFATE 40 GM/1000ML IV SOLN: 2.0000 g/h | INTRAVENOUS | Status: DC

## 2020-10-22 MED ORDER — OXYCODONE HCL 5 MG PO TABS: 10.0000 mg | ORAL_TABLET | ORAL | Status: DC | PRN

## 2020-10-22 MED ORDER — FENTANYL CITRATE (PF) 100 MCG/2ML IJ SOLN
INTRAMUSCULAR | Status: DC | PRN
  Administered 2020-10-22: 100 ug via EPIDURAL

## 2020-10-22 MED ORDER — KETOROLAC TROMETHAMINE 30 MG/ML IJ SOLN
INTRAMUSCULAR | Status: AC
  Filled 2020-10-22: qty 1

## 2020-10-22 MED ORDER — LIDOCAINE 2% (20 MG/ML) 5 ML SYRINGE
INTRAMUSCULAR | Status: DC | PRN
  Administered 2020-10-22: 100 mg via INTRAVENOUS
  Administered 2020-10-22: 60 mg via INTRAVENOUS

## 2020-10-22 MED ORDER — SODIUM CHLORIDE 0.9 % IV SOLN
500.0000 mg | INTRAVENOUS | Status: AC
  Administered 2020-10-22: 500 mg via INTRAVENOUS
  Filled 2020-10-22: qty 500

## 2020-10-22 MED ORDER — MAGNESIUM SULFATE 40 GM/1000ML IV SOLN
INTRAVENOUS | Status: AC
  Administered 2020-10-22: 2 g/h via INTRAVENOUS
  Filled 2020-10-22: qty 1000

## 2020-10-22 MED ORDER — TRANEXAMIC ACID-NACL 1000-0.7 MG/100ML-% IV SOLN
INTRAVENOUS | Status: AC
  Filled 2020-10-22: qty 100

## 2020-10-22 MED ORDER — CARBOPROST TROMETHAMINE 250 MCG/ML IM SOLN
250.0000 ug | Freq: Once | INTRAMUSCULAR | Status: AC
  Administered 2020-10-22: 250 ug via INTRAMUSCULAR

## 2020-10-22 MED ORDER — LOPERAMIDE HCL 2 MG PO CAPS
2.0000 mg | ORAL_CAPSULE | Freq: Once | ORAL | Status: AC
  Administered 2020-10-22: 2 mg via ORAL
  Filled 2020-10-22 (×2): qty 1

## 2020-10-22 MED ORDER — SODIUM CHLORIDE 0.9% FLUSH: 3.0000 mL | INTRAVENOUS | Status: DC | PRN

## 2020-10-22 MED ORDER — MENTHOL 3 MG MT LOZG
1.0000 | LOZENGE | OROMUCOSAL | Status: DC | PRN
  Filled 2020-10-22: qty 9

## 2020-10-22 MED ORDER — NALBUPHINE HCL 10 MG/ML IJ SOLN: 5.0000 mg | Freq: Once | INTRAMUSCULAR | Status: DC | PRN

## 2020-10-22 MED ORDER — SODIUM CHLORIDE 0.9% FLUSH
50.0000 mL | Freq: Once | INTRAVENOUS | Status: DC
  Filled 2020-10-22: qty 51

## 2020-10-22 MED ORDER — SOD CITRATE-CITRIC ACID 500-334 MG/5ML PO SOLN
30.0000 mL | ORAL | Status: AC
  Administered 2020-10-22: 30 mL via ORAL

## 2020-10-22 MED ORDER — ONDANSETRON HCL 4 MG/2ML IJ SOLN
INTRAMUSCULAR | Status: DC | PRN
  Administered 2020-10-22: 4 mg via INTRAVENOUS

## 2020-10-22 MED ORDER — SODIUM CHLORIDE FLUSH 0.9 % IV SOLN
INTRAVENOUS | Status: DC | PRN
  Administered 2020-10-22: 100 mL

## 2020-10-22 MED ORDER — CLINDAMYCIN PHOSPHATE 900 MG/50ML IV SOLN
900.0000 mg | Freq: Three times a day (TID) | INTRAVENOUS | Status: DC
  Administered 2020-10-22 – 2020-10-23 (×3): 900 mg via INTRAVENOUS
  Filled 2020-10-22 (×3): qty 50

## 2020-10-22 MED ORDER — TETANUS-DIPHTH-ACELL PERTUSSIS 5-2.5-18.5 LF-MCG/0.5 IM SUSY
0.5000 mL | PREFILLED_SYRINGE | INTRAMUSCULAR | Status: DC | PRN
  Filled 2020-10-22: qty 0.5

## 2020-10-22 MED ORDER — SODIUM CHLORIDE 0.9 % IV SOLN: INTRAVENOUS | Status: DC

## 2020-10-22 MED ORDER — MISOPROSTOL 200 MCG PO TABS
800.0000 ug | ORAL_TABLET | Freq: Once | ORAL | Status: AC
  Administered 2020-10-22: 800 ug via RECTAL

## 2020-10-22 MED ORDER — NALOXONE HCL 4 MG/10ML IJ SOLN
1.0000 ug/kg/h | INTRAVENOUS | Status: DC | PRN
  Filled 2020-10-22: qty 5

## 2020-10-22 MED ORDER — FENTANYL CITRATE (PF) 100 MCG/2ML IJ SOLN
INTRAMUSCULAR | Status: DC | PRN
  Administered 2020-10-22: 50 ug via INTRAVENOUS

## 2020-10-22 MED ORDER — MEPERIDINE HCL 25 MG/ML IJ SOLN: 6.2500 mg | INTRAMUSCULAR | Status: DC | PRN

## 2020-10-22 MED ORDER — NALBUPHINE HCL 10 MG/ML IJ SOLN
5.0000 mg | Freq: Once | INTRAMUSCULAR | Status: DC | PRN
Start: 2020-10-22 — End: 2020-10-24

## 2020-10-22 MED ORDER — DIPHENHYDRAMINE HCL 25 MG PO CAPS: 25.0000 mg | ORAL_CAPSULE | ORAL | Status: DC | PRN

## 2020-10-22 MED ORDER — ACETAMINOPHEN 325 MG PO TABS
650.0000 mg | ORAL_TABLET | Freq: Four times a day (QID) | ORAL | Status: DC
  Administered 2020-10-22 – 2020-10-23 (×3): 650 mg via ORAL
  Filled 2020-10-22 (×3): qty 2

## 2020-10-22 MED ORDER — NALOXONE HCL 0.4 MG/ML IJ SOLN: 0.4000 mg | INTRAMUSCULAR | Status: DC | PRN

## 2020-10-22 MED ORDER — OXYTOCIN-SODIUM CHLORIDE 30-0.9 UT/500ML-% IV SOLN
2.5000 [IU]/h | INTRAVENOUS | Status: DC
  Administered 2020-10-22: 2.5 [IU]/h via INTRAVENOUS

## 2020-10-22 MED ORDER — DIPHENHYDRAMINE HCL 25 MG PO CAPS: 25.0000 mg | ORAL_CAPSULE | Freq: Four times a day (QID) | ORAL | Status: DC | PRN

## 2020-10-22 MED ORDER — PRENATAL MULTIVITAMIN CH
1.0000 | ORAL_TABLET | Freq: Every day | ORAL | Status: DC
  Administered 2020-10-23 – 2020-10-24 (×2): 1 via ORAL
  Filled 2020-10-22 (×2): qty 1

## 2020-10-22 MED ORDER — SIMETHICONE 80 MG PO CHEW
80.0000 mg | CHEWABLE_TABLET | Freq: Three times a day (TID) | ORAL | Status: DC
  Administered 2020-10-22 – 2020-10-24 (×6): 80 mg via ORAL
  Filled 2020-10-22 (×6): qty 1

## 2020-10-22 MED ORDER — CARBOPROST TROMETHAMINE 250 MCG/ML IM SOLN
INTRAMUSCULAR | Status: AC
  Filled 2020-10-22: qty 1

## 2020-10-22 MED ORDER — NALBUPHINE HCL 10 MG/ML IJ SOLN: 5.0000 mg | INTRAMUSCULAR | Status: DC | PRN

## 2020-10-22 MED ORDER — SIMETHICONE 80 MG PO CHEW
80.0000 mg | CHEWABLE_TABLET | ORAL | Status: DC | PRN
  Administered 2020-10-22: 80 mg via ORAL
  Filled 2020-10-22: qty 1

## 2020-10-22 MED ORDER — DEXTROSE 5 % IV SOLN
3.0000 g | INTRAVENOUS | Status: AC
  Administered 2020-10-22: 3 g via INTRAVENOUS
  Filled 2020-10-22: qty 3

## 2020-10-22 MED ORDER — ACETAMINOPHEN 500 MG PO TABS: 1000.0000 mg | ORAL_TABLET | Freq: Four times a day (QID) | ORAL | Status: DC

## 2020-10-22 MED ORDER — OXYCODONE HCL 5 MG PO TABS
5.0000 mg | ORAL_TABLET | ORAL | Status: DC | PRN
Start: 2020-10-22 — End: 2020-10-24
  Filled 2020-10-22: qty 1

## 2020-10-22 MED ORDER — BUPIVACAINE HCL (PF) 0.5 % IJ SOLN: 30.0000 mL | Freq: Once | INTRAMUSCULAR | Status: DC

## 2020-10-22 MED ORDER — KETOROLAC TROMETHAMINE 30 MG/ML IJ SOLN: 30.0000 mg | Freq: Once | INTRAMUSCULAR | Status: DC

## 2020-10-22 MED ORDER — GABAPENTIN 300 MG PO CAPS
300.0000 mg | ORAL_CAPSULE | Freq: Every day | ORAL | Status: DC
  Administered 2020-10-22 – 2020-10-23 (×2): 300 mg via ORAL
  Filled 2020-10-22 (×2): qty 1

## 2020-10-22 MED ORDER — SODIUM CHLORIDE 0.9 % IV SOLN
INTRAVENOUS | Status: DC | PRN
  Administered 2020-10-22: 20 ug/min via INTRAVENOUS

## 2020-10-22 MED ORDER — NALBUPHINE HCL 10 MG/ML IJ SOLN
5.0000 mg | INTRAMUSCULAR | Status: DC | PRN
Start: 2020-10-22 — End: 2020-10-24

## 2020-10-22 MED ORDER — SODIUM CHLORIDE (PF) 0.9 % IJ SOLN
INTRAMUSCULAR | Status: AC
  Filled 2020-10-22: qty 50

## 2020-10-22 MED ORDER — ZOLPIDEM TARTRATE 5 MG PO TABS: 5.0000 mg | ORAL_TABLET | Freq: Every evening | ORAL | Status: DC | PRN

## 2020-10-22 MED ORDER — MORPHINE SULFATE (PF) 0.5 MG/ML IJ SOLN
INTRAMUSCULAR | Status: AC
  Filled 2020-10-22: qty 10

## 2020-10-22 MED ORDER — IBUPROFEN 600 MG PO TABS: 600.0000 mg | ORAL_TABLET | Freq: Four times a day (QID) | ORAL | Status: DC

## 2020-10-22 MED ORDER — LIDOCAINE HCL (PF) 2 % IJ SOLN
INTRAMUSCULAR | Status: AC
  Filled 2020-10-22: qty 5

## 2020-10-22 MED ORDER — IBUPROFEN 600 MG PO TABS
ORAL_TABLET | ORAL | Status: AC
  Administered 2020-10-22: 600 mg via ORAL
  Filled 2020-10-22: qty 1

## 2020-10-22 MED ORDER — BUPIVACAINE LIPOSOME 1.3 % IJ SUSP: 20.0000 mL | Freq: Once | INTRAMUSCULAR | Status: DC

## 2020-10-22 MED ORDER — ACETAMINOPHEN 10 MG/ML IV SOLN
INTRAVENOUS | Status: AC
  Filled 2020-10-22: qty 100

## 2020-10-22 SURGICAL SUPPLY — 31 items
BARRIER ADHS 3X4 INTERCEED (GAUZE/BANDAGES/DRESSINGS) ×2 IMPLANT
CHLORAPREP W/TINT 26 (MISCELLANEOUS) ×2 IMPLANT
COVER WAND RF STERILE (DRAPES) ×2 IMPLANT
DRSG TELFA 3X8 NADH (GAUZE/BANDAGES/DRESSINGS) ×2 IMPLANT
ELECT CAUTERY BLADE 6.4 (BLADE) ×2 IMPLANT
ELECT REM PT RETURN 9FT ADLT (ELECTROSURGICAL) ×2
ELECTRODE REM PT RTRN 9FT ADLT (ELECTROSURGICAL) ×1 IMPLANT
EXTENDER TRAXI PANNICULUS (MISCELLANEOUS) ×1 IMPLANT
GAUZE SPONGE 4X4 12PLY STRL (GAUZE/BANDAGES/DRESSINGS) ×2 IMPLANT
GLOVE SURG SYN 8.0 (GLOVE) ×2 IMPLANT
GOWN STRL REUS W/ TWL LRG LVL3 (GOWN DISPOSABLE) ×2 IMPLANT
GOWN STRL REUS W/ TWL XL LVL3 (GOWN DISPOSABLE) ×1 IMPLANT
GOWN STRL REUS W/TWL LRG LVL3 (GOWN DISPOSABLE) ×2
GOWN STRL REUS W/TWL XL LVL3 (GOWN DISPOSABLE) ×1
MANIFOLD NEPTUNE II (INSTRUMENTS) ×2 IMPLANT
MAT PREVALON FULL STRYKER (MISCELLANEOUS) ×2 IMPLANT
NEEDLE HYPO 22GX1.5 SAFETY (NEEDLE) ×2 IMPLANT
NS IRRIG 1000ML POUR BTL (IV SOLUTION) ×2 IMPLANT
PACK C SECTION AR (MISCELLANEOUS) ×2 IMPLANT
PAD OB MATERNITY 4.3X12.25 (PERSONAL CARE ITEMS) ×2 IMPLANT
PAD PREP 24X41 OB/GYN DISP (PERSONAL CARE ITEMS) ×2 IMPLANT
STRAP SAFETY 5IN WIDE (MISCELLANEOUS) ×2 IMPLANT
SUCT VACUUM KIWI BELL (SUCTIONS) ×2 IMPLANT
SUT CHROMIC 1 CTX 36 (SUTURE) ×6 IMPLANT
SUT PLAIN GUT 0 (SUTURE) ×4 IMPLANT
SUT VIC AB 0 CT1 36 (SUTURE) ×6 IMPLANT
SUT VIC AB 1 CT1 36 (SUTURE) ×2 IMPLANT
SYR 30ML LL (SYRINGE) ×4 IMPLANT
TAPE PAPER 2X10 WHT MICROPORE (GAUZE/BANDAGES/DRESSINGS) ×2 IMPLANT
TRAXI PANNICULUS EXTENDER (MISCELLANEOUS) ×1
TUBE CONNECTING 6X3/16 (MISCELLANEOUS) ×2 IMPLANT

## 2020-10-22 NOTE — Progress Notes (Signed)
RN at bedside at 1125 for fundal assessment. Upon assessment uterus firm/midline/UE with large clots. QBL 330. RN notified CNM and MD in the OR of findings. Verbal order given for TXA. 2nd IV started for infusion of TXA. 1135 and 1145 fundal assessment normal. At 1150 RN performed another fundal assessment with large clots. QBL 430. CNM in OR notified. At 1215 CNM and MD at bedside to assess bleeding and vital signs. Pt has no complaints of dizziness or feeling lightheaded. CNM placed cytotec. Will continue to assess bleeding.

## 2020-10-22 NOTE — Brief Op Note (Signed)
10/22/2020  10:24 AM  Huffman:  Krista Huffman  25 y.o. female  PRE-OPERATIVE DIAGNOSIS: Active phase arrest, chorioamnionitis   POST-OPERATIVE DIAGNOSIS:same as above  PROCEDURE:  Procedure(s): CESAREAN SECTION LTCS, vacuum assist  SURGEON:  Surgeon(s) and Role:    * Ritamarie Arkin, Ihor Austin, MD - Primary  PHYSICIAN ASSISTANT: Haroldine Laws, CNM  ASSISTANTS: none   ANESTHESIA:   epidural  EBL:  600 mL iof 900 cc uo 50 cc  BLOOD ADMINISTERED:none  DRAINS: Urinary Catheter (Foley)   LOCAL MEDICATIONS USED:  MARCAINE    and BUPIVICAINE   SPECIMEN:  Source of Specimen:  placenta  DISPOSITION OF SPECIMEN:  PATHOLOGY  COUNTS:  YES  TOURNIQUET:  * No tourniquets in log *  DICTATION: .Other Dictation: Dictation Number verbal  PLAN OF CARE: Admit to inpatient   [facility]uff[facility] DISPOSITION:  PACU - hemodynamically stable.   Delay start of Pharmacological VTE agent (>24hrs) due to surgical blood loss or risk of bleeding: not applicable

## 2020-10-22 NOTE — Lactation Note (Signed)
This note was copied from a baby's chart. Lactation Consultation Note  Patient Name: Krista Huffman EYEMV'V Date: 10/22/2020 Reason for consult: L&D Initial assessment;Primapara;1st time breastfeeding;Term Age:24 hours  Maternal Data Has patient been taught Hand Expression?: Yes Does the patient have breastfeeding experience prior to this delivery?: No  Feeding Mother's Current Feeding Choice: Breast Milk Assisted mom with latching baby to left breast in cradle hold, baby latched easily and is nursing well with occ swallow noted, difficult to assess with noise in room, nursed 30 min on left, switched to right breast and latched easily to this breast, nursing well with stimulation LATCH Score Latch: Grasps breast easily, tongue down, lips flanged, rhythmical sucking.  Audible Swallowing: A few with stimulation  Type of Nipple: Everted at rest and after stimulation  Comfort (Breast/Nipple): Soft / non-tender  Hold (Positioning): Assistance needed to correctly position infant at breast and maintain latch.  LATCH Score: 8   Lactation Tools Discussed/Used  Encouraged mom to watch for feeding cues, LC name written on white board, mom to remain in Birthplace on MgSO4 today    Interventions Interventions: Breast feeding basics reviewed;Assisted with latch;Skin to skin;Hand express;Adjust position;Breast compression;Support pillows;Education  Discharge WIC Program: Yes  Consult Status Consult Status: Follow-up Date: 10/22/20 Follow-up type: In-patient    [name] 10/22/2020, 12:04 PM

## 2020-10-22 NOTE — Progress Notes (Signed)
PHARMACIST - PHYSICIAN COMMUNICATION  CONCERNING:  Enoxaparin (Lovenox) for DVT Prophylaxis    RECOMMENDATION: Patient was prescribed enoxaprin 40mg  q24 hours for VTE prophylaxis.   Filed Weights   10/19/20 1503  Weight: 125.2 kg (276 lb)    Body mass index is 40.76 kg/m.  Estimated Creatinine Clearance: 153.7 mL/min (by C-G formula based on SCr of 0.64 mg/dL).   Based on [facility] policy patient is candidate for enoxaparin 0.5mg /kg TBW SQ every 24 hours based on BMI being >30.  DESCRIPTION: Pharmacy has adjusted enoxaparin dose per [facility] policy.  Patient is now receiving enoxaparin 60 mg every 24 hours    [facility], PharmD, BCPS 10/22/2020 3:48 PM

## 2020-10-22 NOTE — Op Note (Signed)
NAME: Krista Huffman, MCNAUGHT MEDICAL RECORD NO: [id number] ACCOUNT NO: [id number] DATE OF BIRTH: 1995/09/06 FACILITY: [facility] LOCATION: [facility] PHYSICIAN: Suzy Bouchard, MD  Operative Report   DATE OF PROCEDURE: 10/22/2020  PREOPERATIVE DIAGNOSES: 1.  40 plus 0 weeks' estimated gestational age. 2.  Preeclampsia with severe features. 3.  Active phase arrest. 4.  Chorioamnionitis.  POSTOPERATIVE DIAGNOSES: 1.  40 plus 0 weeks' estimated gestational age. 2.  Preeclampsia with severe features. 3.  Active phase arrest. 4.  Chorioamnionitis. 5.  Vigorous female, delivered.  PROCEDURE: 1.  Primary low transverse cesarean section. 2.  Vacuum-assisted delivery.  SURGEON:  Suzy Bouchard, MD  ASSISTANT:  Haroldine Laws, certified nurse midwife.  ANESTHESIA: Surgical dosing of continuous lumbar epidural.  INDICATIONS:  A 25 year old gravida 1, para 0.  The patient underwent a 3-day induction and did not progress past 7 cm and developed a fever consistent with chorioamnionitis.  The patient has been counseled for the indication of the procedure and risk  involved.  DESCRIPTION OF PROCEDURE:  After surgical dosing of continuous lumbar epidural, the patient was placed in dorsal supine position, hip roll on the right side.  The patient had received 500 mg azithromycin and 3 grams of Ancef for surgical prophylaxis in  addition to her previously administered medications for chorioamnionitis.  A timeout was performed.  The traxi abdominal apparatus was used to elevate the pannus cephalad.  A Pfannenstiel incision was made two fingerbreadths above the symphysis pubis.   Sharp dissection was used to identify the fascia.  Fascia was opened in the midline and opened in a transverse fashion.  Superior aspect of the fascia was grasped with Kocher clamps and the recti muscles were dissected free.  The inferior aspect of the  fascia was grasped with Kocher clamps and the pyramidalis muscle was  dissected free.  Entry into the peritoneal cavity was accomplished sharply.  Vesicouterine peritoneal fold was identified and bladder flap was created and the bladder was reflected  inferiorly.  A low transverse uterine incision was made.  Upon entry into the endometrial cavity, clear fluid resulted.  The incision was extended with blunt transverse traction.  Fetal head was brought to the incision and a Kiwi vacuum was applied to  the occiput.  With one gentle pull, the head was delivered and the vacuum was removed and the shoulders and body were delivered without difficulty.  Vigorous female was then dried on the mother's abdomen for 60 seconds for delayed cord clamping.  Cord was  clamped and infant was passed to nursery staff who assigned Apgar scores of 8 and 8, weight 3590 grams and time of birth was 0933 on 10/22/2020.  Placenta was manually delivered and the uterus was exteriorized.  Endometrial cavity was wiped clean with a  laparotomy tape.  Uterine incision was closed with one chromic suture in a running locking fashion.  Good approximation of edges.  Good hemostasis noted.  Fallopian tubes and ovaries appeared normal.  Posterior cul-de-sac was irrigated and suctioned.   Uterus was placed back into the abdominal cavity.  Uterine incision appeared hemostatic and the paracolic gutters were wiped clean with laparotomy tape.  Interceed was placed over the uterine incision in a T-shape fashion.  The fascia was then closed  after controlling a small amount of bleeding on the right subfascial rectus area.  This was controlled with 2-0 Vicryl suture.  The fascia was then closed with 0 Vicryl suture in a running nonlocking fashion, two separate sutures were  used.  Fascial  edges were then injected with Exparel solution consisting of 20 mL of 1.3% Exparel plus 30 mL of 0.5% Marcaine plus 50 mL normal saline.  45 mL of this solution was injected subfascially.  Subcutaneous tissues were irrigated and bovied for  hemostasis,  given the depth of the subcutaneous tissues of 4 cm, the subcutaneous tissues were closed with a running 2-0 chromic suture.  The skin was reapproximated with Insorb absorbable staples with good cosmetic effect.  Additional 40 mL of Exparel solution was  injected beneath the skin.  QUANTITATIVE BLOOD LOSS: 600 mL.  INTRAOPERATIVE FLUIDS:  900 mL.  URINE OUTPUT:  50 mL.  The patient tolerated the procedure well and was taken to recovery room in good condition.   SHW D: 10/22/2020 10:57:44 am T: 10/22/2020 10:45:00 pm  JOB: [id number]/ [id number]

## 2020-10-22 NOTE — Transfer of Care (Signed)
Immediate Anesthesia Transfer of Care Note  Patient: Krista Huffman  Procedure(s) Performed: CESAREAN SECTION  Patient Location: PACU  Anesthesia Type:Epidural  Level of Consciousness: awake, alert  and oriented  Airway & Oxygen Therapy: Patient Spontanous Breathing  Post-op Assessment: Report given to RN and Post -op Vital signs reviewed and stable  Post vital signs: Reviewed and stable  Last Vitals:  Vitals Value Taken Time  BP 126/81 10/22/20 1041  Temp 37.1 C 10/22/20 1041  Pulse 79 10/22/20 1041  Resp 17 10/22/20 1041  SpO2 96 % 10/22/20 1041    Last Pain:  Vitals:   10/22/20 1041  TempSrc:   PainSc: 0-No pain      Patients Stated Pain Goal: 0 (10/20/20 1750)  Complications: No complications documented.

## 2020-10-22 NOTE — Discharge Summary (Signed)
Obstetrical Discharge Summary  Patient Name: Krista Huffman DOB: Nov 28, 1995 MRN: [id number]  Date of Admission: 10/19/2020 Date of Delivery: 10/22/2020 Delivered by: Beverly Gust MD Date of Discharge: 10/24/2020  Primary OB: [facility]  [facility]  GLO:VFIEPPI'R last menstrual period was 01/03/2020 (approximate). EDC Estimated Date of Delivery: 10/22/20 Gestational Age at Delivery: [redacted]w[redacted]d   Antepartum complications: as below, HTN prompting admission Admitting Diagnosis:  Secondary Diagnosis: Patient Active Problem List   Diagnosis Date Noted  . Postpartum hemorrhage 10/22/2020  . Gestational hypertension 10/19/2020  . Chlamydia 09/28/20 10/05/2020  . Obesity (BMI 30.0-34.9) 08/05/2020  . COVID-19 virus infection 06/04/20 06/15/2020  . Obesity affecting pregnancy, antepartum 03/22/2020  . Supervision of high risk pregnancy, antepartum 03/22/2020  . Marijuana use +UDS 03/22/20, 06/15/20, 07/21/20 03/22/2020  . Low-level of literacy completed 9th grade 03/22/2020    Augmentation: AROM, Pitocin, Cytotec and IP Foley Complications: Intrauterine Inflammation or infection (Chorioamniotis), APA Intrapartum complications/course: 3 day induction failed to progress past 7 cm . Chorioamnionitis , meconium staining . Preeclampsia with severe features on Magnesium  Date of Delivery: 10/24/2020  Delivered By: Beverly Gust MD Delivery Type: primary cesarean section, low transverse incision Anesthesia: epidural, surgical dosing  Placenta: manual Laceration: none Episiotomy: none Newborn Data: Live born child female  Birth Weight:  3590 gm APGAR: , 8/8  Newborn Delivery   Birth date/time:  Delivery type: C-Section, Low Transverse Trial of labor: Yes C-section categorization: Primary        Postpartum Procedures: transfusion 2 units PRBCs.   EdinburghInocente Salles Postnatal Depression Scale Screening Tool 10/23/2020  I have been able to laugh and see the funny side of things. 0  I have  looked forward with enjoyment to things. 0  I have blamed myself unnecessarily when things went wrong. 1  I have been anxious or worried for no good reason. 0  I have felt scared or panicky for no good reason. 0  Things have been getting on top of me. 1  I have been so unhappy that I have had difficulty sleeping. 0  I have felt sad or miserable. 0  I have been so unhappy that I have been crying. 1  The thought of harming myself has occurred to me. 0  Edinburgh Postnatal Depression Scale Total 3    Post partum course: Cesarean Section:  Patient had an uncomplicated postpartum course.  By time of discharge on POD#2, her pain was controlled on oral pain medications; she had appropriate lochia and was ambulating, voiding without difficulty, tolerating regular diet and passing flatus.   She was deemed stable for discharge to home.    Discharge Physical Exam:  BP 135/77 (BP Location: Right Arm)   Pulse 91   Temp 98.9 F (37.2 C) (Oral)   Resp 20   Ht 5\' 9"  (1.753 m)   Wt 125.2 kg   LMP 01/03/2020 (Approximate) Comment: normal  SpO2 98%   Breastfeeding Unknown   BMI 40.76 kg/m   General: NAD CV: RRR Pulm: CTABL, nl effort ABD: s/nd/nt, fundus firm and below the umbilicus Lochia: moderate Incision: c/d/i DVT Evaluation: LE non-ttp, no evidence of DVT on exam.  Hemoglobin  Date Value Ref Range Status  10/24/2020 7.9 (L) 12.0 - 15.0 g/dL Final  10/26/2020 51/88/4166 11.1 - 15.9 g/dL Final   HCT  Date Value Ref Range Status  10/24/2020 22.2 (L) 36.0 - 46.0 % Final   Hematocrit  Date Value Ref Range Status  03/22/2020 38.6 34.0 - 46.6 %  Final     Disposition: stable, discharge to home. Baby Feeding: breastmilk and formula Baby Disposition: home with mom  Rh Immune globulin given: n/a Rubella vaccine given: n/a .  Varicella vaccine offered prior to DC Tdap vaccine given in AP or PP setting: 08/05/20 Flu vaccine given in AP or PP setting: 03/22/20  Contraception:  Depo  Prenatal Labs:  Blood type/Rh O+  Antibody screen neg  Rubella immune  Varicella Non immune  RPR NR  HBsAg Neg  HIV NR  GC neg  Chlamydia Positive/treated with Azith  Genetic screening negative  1 hour GTT 114  3 hour GTT N/A  GBS neg      Plan:  [place] Patient was discharged to home in good condition. Follow-up appointment with delivering provider in 2-3d for BP check and 2 weeks for post-op appt with Dr Feliberto Gottron.   Discharge Medications: Allergies as of 10/24/2020   No Known Allergies     Medication List    STOP taking these medications   aspirin EC 81 MG tablet     TAKE these medications   acetaminophen 325 MG tablet Commonly known as: TYLENOL Take 2 tablets (650 mg total) by mouth every 6 (six) hours as needed for mild pain. What changed:   medication strength  how much to take  reasons to take this   coconut oil Oil Apply 1 application topically as needed.   ferrous sulfate 325 (65 FE) MG tablet Take 1 tablet (325 mg total) by mouth 2 (two) times daily with a meal. For anemia, take with Vitamin C   ibuprofen 600 MG tablet Commonly known as: ADVIL Take 1 tablet (600 mg total) by mouth every 6 (six) hours as needed for fever or headache.   NIFEdipine 30 MG 24 hr tablet Commonly known as: ADALAT CC Take 1 tablet (30 mg total) by mouth daily.   oxyCODONE 5 MG immediate release tablet Commonly known as: Oxy IR/ROXICODONE Take 1 tablet (5 mg total) by mouth every 6 (six) hours as needed for up to 7 days for moderate pain.   prenatal multivitamin Tabs tablet Take 1 tablet by mouth daily at 12 noon.   senna-docusate 8.6-50 MG tablet Commonly known as: Senokot-S Take 2 tablets by mouth daily.   simethicone 80 MG chewable tablet Commonly known as: MYLICON Chew 1 tablet (80 mg total) by mouth 3 (three) times daily after meals.        Follow-up Information    [facility]. Schedule an appointment as soon as possible for a  visit in 3 day(s).   Why: scheduled BP check visit with any Midwife.  Contact information: [address] [place] [address] [phone]       Schermerhorn, Ihor Austin, MD. Schedule an appointment as soon as possible for a visit in 2 week(s).   Specialty: Obstetrics and Gynecology Why: Post-Op appt Contact information: [address] [place] [address] [phone]               Signed: Randa Ngo, CNM 10/24/2020 9:44 AM

## 2020-10-22 NOTE — Progress Notes (Signed)
Post op uterine atony  300+ 400 cc. IV TXA being administered Ordered for 800 mcg rectal cytootec . Chorio and Iv magnesium at risk for PP bleeding . Continue to monitor

## 2020-10-22 NOTE — Progress Notes (Signed)
RN called to bedside at 1310 by pt. Pt reports feeling lightheaded and dizzy. RN assessed fundus. Firm/midline/1/U. QBL 220. BP checked. Pt given hemabate per order. BP retaken. Pt reports feeling better at this time

## 2020-10-22 NOTE — Progress Notes (Signed)
MD notified of CBC results along with vital signs and low urine output the last two hours. Verbal order for bolus.

## 2020-10-22 NOTE — Progress Notes (Signed)
Patient ID: Krista Huffman, female   DOB: [birth year], 25 y.o.   MRN: [id number] Pt is now active phase arrest . Has not progressed past 7 cm / station -1 . Adequate MVU . Now infected  Chorio. On Gent and amp . I have counseled the pt regarding the procedure . All questions answered . BP have been good . Still on magnesium . Marland Kitchen Start azithromycin + 3 gm ancef for surgery .

## 2020-10-22 NOTE — Progress Notes (Addendum)
Labor Progress Note  Krista Huffman is a 25 y.o. G1P0000 at [redacted]w[redacted]d by ultrasound admitted for Pre-E with severe features by severe range BP.   Subjective: mild cough intermittently, no acute SOB, no chest pain.  Denies HA, VD or RUQ pain. Comfortable with epidural, some pressure.   Objective: BP (!) 122/57   Pulse (!) 108   Temp 98.7 F (37.1 C) (Oral)   Resp 17   Ht 5\' 9"  (1.753 m)   Wt 125.2 kg   LMP 01/03/2020 (Approximate) Comment: normal  SpO2 95%   BMI 40.76 kg/m  Notable VS details: s/p tx with IV Labetalol 20 then 40mg , last at 1044 5/26  Pulm: continues to have intermittent cough, but lungs clear.  Card: RRR, no murmur Extrem: bilat 1+ edema, 2+ DTRs, no clonus; SCDs in place  Fetal Assessment: FHT:  FHR: 130 bpm, variability: moderate,  accelerations:  Present,  decelerations:  Present intermittent early decels Category/reactivity:  Category I UC:  q2-3.5 min, Pitocin at 15mu/min, adequate MVUs SVE: 7-8/90/-1   Membrane status: AROM 5/26 at 1648 Amniotic color: light meconium    Assessment / Plan: IOL for Pre-E with severe features.   Labor: day 3 induction, s/p ripening with cytotec 4 doses, Pitocin off and on over 10/20/20, Navos Cath placed. Pitocin restarted at noon 5/26, AROM performed 5/26 at 1648- light meconium - now intermittent pressure and urge to push.   - SVE per CNM 7-8/90/-1   Preeclampsia:  on magnesium sulfate, no signs or symptoms of toxicity, intake and ouput balanced and labs stable; repeat Labs stable, incentive spirometry initiated.    Hypokalemia: replace with PO potassium due to K+ 3.3, Kdur 6/26 BID  Fetal Wellbeing:  Category I  Pain Control:  Epidural I/D:  Fever- axillary temp 99.9 after tylenol, FHR baseline change to 145bpm, Amp q6hr and Gent q24hr   04-29-1992, CNM 10/22/2020, 7:33 AM

## 2020-10-23 LAB — COMPREHENSIVE METABOLIC PANEL
ALT: 15 U/L (ref 0–44)
AST: 30 U/L (ref 15–41)
Albumin: 2.1 g/dL — ABNORMAL LOW (ref 3.5–5.0)
Alkaline Phosphatase: 94 U/L (ref 38–126)
Anion gap: 8 (ref 5–15)
BUN: 9 mg/dL (ref 6–20)
CO2: 22 mmol/L (ref 22–32)
Calcium: 7.4 mg/dL — ABNORMAL LOW (ref 8.9–10.3)
Chloride: 105 mmol/L (ref 98–111)
Creatinine, Ser: 0.71 mg/dL (ref 0.44–1.00)
GFR, Estimated: >60 mL/min (ref >60)
Glucose, Bld: 93 mg/dL (ref 70–99)
Potassium: 3.8 mmol/L (ref 3.5–5.1)
Sodium: 135 mmol/L (ref 135–145)
Total Bilirubin: 0.6 mg/dL (ref 0.3–1.2)
Total Protein: 5.4 g/dL — ABNORMAL LOW (ref 6.5–8.1)

## 2020-10-23 LAB — CBC
HCT: 17 % — ABNORMAL LOW (ref 36.0–46.0)
Hemoglobin: 5.7 g/dL — ABNORMAL LOW (ref 12.0–15.0)
MCH: 29.1 pg (ref 26.0–34.0)
MCHC: 33.5 g/dL (ref 30.0–36.0)
MCV: 86.7 fL (ref 80.0–100.0)
Platelets: 175 10*3/uL (ref 150–400)
RBC: 1.96 MIL/uL — ABNORMAL LOW (ref 3.87–5.11)
RDW: 13.5 % (ref 11.5–15.5)
WBC: 12 10*3/uL — ABNORMAL HIGH (ref 4.0–10.5)
nRBC: 0 % (ref 0.0–0.2)

## 2020-10-23 LAB — HEMOGLOBIN AND HEMATOCRIT, BLOOD
HCT: 23.3 % — ABNORMAL LOW (ref 36.0–46.0)
Hemoglobin: 8.3 g/dL — ABNORMAL LOW (ref 12.0–15.0)

## 2020-10-23 LAB — PREPARE RBC (CROSSMATCH)

## 2020-10-23 MED ORDER — FUROSEMIDE 10 MG/ML IJ SOLN
20.0000 mg | Freq: Once | INTRAMUSCULAR | Status: AC
  Administered 2020-10-23: 20 mg via INTRAVENOUS
  Filled 2020-10-23: qty 2

## 2020-10-23 MED ORDER — DIPHENHYDRAMINE HCL 25 MG PO CAPS
25.0000 mg | ORAL_CAPSULE | Freq: Once | ORAL | Status: AC
  Administered 2020-10-23: 25 mg via ORAL
  Filled 2020-10-23: qty 1

## 2020-10-23 MED ORDER — IBUPROFEN 600 MG PO TABS
ORAL_TABLET | ORAL | Status: AC
  Administered 2020-10-23: 600 mg via ORAL
  Filled 2020-10-23: qty 1

## 2020-10-23 MED ORDER — IBUPROFEN 600 MG PO TABS: 600.0000 mg | ORAL_TABLET | Freq: Once | ORAL | Status: AC

## 2020-10-23 MED ORDER — ACETAMINOPHEN 325 MG PO TABS
650.0000 mg | ORAL_TABLET | Freq: Four times a day (QID) | ORAL | Status: DC | PRN
  Administered 2020-10-23 – 2020-10-24 (×2): 650 mg via ORAL
  Filled 2020-10-23 (×2): qty 2

## 2020-10-23 MED ORDER — SODIUM CHLORIDE 0.9% IV SOLUTION: Freq: Once | INTRAVENOUS | Status: AC

## 2020-10-23 MED ORDER — IBUPROFEN 600 MG PO TABS
600.0000 mg | ORAL_TABLET | Freq: Four times a day (QID) | ORAL | Status: DC | PRN
  Administered 2020-10-23 – 2020-10-24 (×2): 600 mg via ORAL
  Filled 2020-10-23 (×2): qty 1

## 2020-10-23 MED ORDER — POTASSIUM CHLORIDE CRYS ER 20 MEQ PO TBCR
40.0000 meq | EXTENDED_RELEASE_TABLET | Freq: Two times a day (BID) | ORAL | Status: DC
  Administered 2020-10-23 (×2): 40 meq via ORAL
  Filled 2020-10-23: qty 4
  Filled 2020-10-23: qty 2

## 2020-10-23 MED ORDER — ACETAMINOPHEN 325 MG PO TABS
650.0000 mg | ORAL_TABLET | Freq: Once | ORAL | Status: AC
  Administered 2020-10-23: 650 mg via ORAL
  Filled 2020-10-23: qty 2

## 2020-10-23 NOTE — Anesthesia Postprocedure Evaluation (Signed)
Anesthesia Post Note  Patient: Krista Huffman  Procedure(s) Performed: CESAREAN SECTION  Patient location during evaluation: Mother Baby Anesthesia Type: Epidural Level of consciousness: oriented and awake and alert Pain management: pain level controlled Vital Signs Assessment: post-procedure vital signs reviewed and stable Respiratory status: spontaneous breathing and respiratory function stable Cardiovascular status: blood pressure returned to baseline and stable Postop Assessment: no headache, no backache and no apparent nausea or vomiting Anesthetic complications: no Comments: On magnesium infusion so has not ambulated yet; endorses full sensation & strength returned to bilateral LEs. Doing well.   No complications documented.   Last Vitals:  Vitals:   10/23/20 0600 10/23/20 0630  BP: (!) 99/50 113/65  Pulse: 83 82  Resp: 18 18  Temp:    SpO2: 94% 92%    Last Pain:  Vitals:   10/23/20 0330  TempSrc: Axillary  PainSc:                  Donato Schultz

## 2020-10-23 NOTE — Clinical Social Work Maternal (Signed)
CLINICAL SOCIAL WORK MATERNAL/CHILD NOTE  Patient Details  Name: Krista Huffman MRN: [id number] Date of Birth: 02-01-1996  Date:  10/23/2020  Clinical Social Worker Initiating Note:  Oretha Ellis, LCSW Date/Time: Initiated:  10/23/20/1530     Child's Name:  Krista Huffman   Biological Parents:  Mother   Need for Interpreter:  None   Reason for Referral:  Current Substance Use/Substance Use During Pregnancy    Address:  [place]. [place] [address]    Phone number:  [phone] (home)     Additional phone number: None  Household Members/Support Persons (HM/SP):   Household Member/Support Person 1   HM/SP Name Relationship DOB or Age  HM/SP -1 Krista Huffman Mom 42  HM/SP -2        HM/SP -3        HM/SP -4        HM/SP -5        HM/SP -6        HM/SP -7        HM/SP -8          Natural Supports (not living in the home):  Parent,Immediate Family,Extended Family   Professional Supports: None   Employment: Unemployed   Type of Work: Family supports mom   Education:      Homebound arranged:    Museum/gallery curator Resources:  Medicaid   Other Resources:  Krista Huffman (Applied for food stamps)   Cultural/Religious Considerations Which May Impact Care:  None specified  Strengths:  Ability to meet basic needs ,Home prepared for child ,Pediatrician chosen   Psychotropic Medications:         Pediatrician:    [employer]  Pediatrician List:   [place] Other ([facility])  [facility]      Pediatrician Fax Number: [phone]  Risk Factors/Current Problems:  Substance Use    Cognitive State:  Alert    Mood/Affect:  Calm    CSW Assessment: CSW received a consult for drug exposed newborn. MOB drug screen positive for Marijuana. Baby drug screen not positive for any substance. Cord screen pending. CSW spoke with RN Misty prior to meeting with MOB. Per RN, no  concerns. CSW met with MOB at bedside. Explained CSW's role and reason for referral. MOB consented for sister-Krista Huffman to stay in room for assessment.    MOB reported she is feeling good post delivery. MOB was alert, appropriate, and attentive to Baby during assessment. Confirmed contact information for MOB. MOB address is [address] [place], [place], [place] [address]. MOB and Baby will be living with and mom at discharge.    [facility] Drug Screen/CPS Report Policy. MOB reported she ate edible marijuana since age 9 and last used was "2 weeks ago." She denied other substance use. CPS Report made to [place] Worker [facility] following assessment with MOB, as required by policy. Informed Lonna Cobb of plan for MOB and Baby to discharge tomorrow (Sunday) and asked to be informed of any barriers to discharge prior to then. Lonna Cobb stated she would staff with her superviosr and provide update. CSW received a call after making report from [place] stating MOB and baby can discharge home and report will be screened out. Lonna Cobb stated if cord blood comes back positive, to make a CPS report.      MOB reported she receives [facility] and  will inform her Workers of 49 birth. MOB plans to use [facility] for [facility]. MOB reported she has a crib, bassinet, car seat (new), clothing, diapers, and all other items needed for Baby. MOB reported she has reliable transportation for herself and Baby. MOB denied resource needs at this time.    MOB reported she has no history of mental health. MOB reported overall she has a good support system and is coping well emotionally at this time. MOB denied SI, HI, or DV.   CSW provided education and information sheets on PPD and SIDS. MOB verbalized understanding. CSW encouraged MOB to reach out to her Provider with any questions or needs for support or resources. MOB denied any needs or questions at this time. CSW  encouraged MOB to reach out if any arise prior to discharge. Please re consult CSW if any additional needs or concerns arise.  CSW Plan/Description:  Perinatal Mood and Anxiety Disorder (PMADs) Education,Sudden Infant Death Syndrome (SIDS) Education,Hospital Drug Screen Policy Information,Child Protective Service Report ,CSW Will Continue to Monitor Umbilical Cord Tissue Drug Screen Results and Make Report if Warranted,CSW Awaiting CPS Disposition Plan    Truitt Merle, LCSW 10/23/2020, 6:51 PM

## 2020-10-23 NOTE — Lactation Note (Signed)
This note was copied from a baby's chart. Lactation Consultation Note  Patient Name: Krista Huffman [id number] Date: 10/23/2020 Reason for consult: Follow-up assessment;Mother's request;Primapara;Term;Other (Comment) (Mom has been giving large volumes of formula via bottle which was discouraged) Age:24 hours  Maternal Data Has patient been taught Hand Expression?: Yes Does the patient have breastfeeding experience prior to this delivery?: No (First baby)  Feeding Mother's Current Feeding Choice: Breast Milk and Formula  Mom has been giving large volumes of formula via bottle.  She reports struggling at times to get him to latch and that's why she has been giving a bottle.  Explained babies had to learn what to do and needed practice at the breast.  If she continued to give bottles and the pacifier she might continue to have issues with getting her to breast feed.  Discussed supply and demand and need for frequent stimulation at the breast to bring in mature milk, ensure a plentiful milk supply and prevent engorgement.  Explained feeding cues and encouraged mom to put him to the breast whenever he demonstrated feeding cues instead of giving the bottle or pacifier.  Demonstrated that she can hand express a few drops of colostrum from the breast.  Assisted mom with putting her to the right breast in football hold skin to skin while sitting up in the chair.  He latched with minimal assistance and began strong, rhythmic sucking with occasional audible swallows.  Unfortunately lab came in to draw labs on baby so she only got 4 minutes of sucking.  Mom agreed to put her back to the breast after lab draw.  Hand out given on what to expect with breast feeding the first 4 days of life and reviewed normal newborn stomach size, normal course of lactation, adequate intake and out put and routine newborn feeding patterns.  Lactation [employer] given and reviewed support groups, informational web sites  and contact numbers.  Lactation name and number written on white board and encouraged to call with any questions, concerns or assistance. LATCH Score Latch: Grasps breast easily, tongue down, lips flanged, rhythmical sucking.  Audible Swallowing: A few with stimulation  Type of Nipple: Everted at rest and after stimulation  Comfort (Breast/Nipple): Soft / non-tender  Hold (Positioning): Assistance needed to correctly position infant at breast and maintain latch.  LATCH Score: 8   Lactation Tools Discussed/Used    Interventions Interventions: Breast feeding basics reviewed;Assisted with latch;Skin to skin;Breast massage;Hand express;Reverse pressure;Breast compression;Adjust position;Support pillows;Position options;Education  Discharge Discharge Education: Engorgement and breast care;Warning signs for feeding baby;Other (comment) WIC Program: Yes  Consult Status Consult Status: Follow-up Follow-up type: Call as needed    Louis Meckel 10/23/2020, 9:22 PM

## 2020-10-23 NOTE — Progress Notes (Signed)
Post Partum Day/POD# 1 Subjective: Doing well, no complaints.  Tolerating regular diet, pain with PO meds. Some incisional discomfort, but manageable.   No CP SOB Fever,Chills, N/V or leg pain; denies nipple or breast pain, no HA change of vision, RUQ/epigastric pain  Feeling a bit "overwhelmed", wants to be able to care for her baby and get OOB.   Objective: BP (!) 112/54   Pulse 75   Temp 98.5 F (36.9 C) (Oral)   Resp 16   Ht 5\' 9"  (1.753 m)   Wt 125.2 kg   LMP 01/03/2020 (Approximate) Comment: normal  SpO2 96%   Breastfeeding Unknown   BMI 40.76 kg/m    Physical Exam:  General: NAD Breasts: soft/nontender CV: RRR Pulm: nl effort, CTABL Abdomen: soft, NT, BS x 4 Incision: Dsg CD, no drainage Lochia: small Uterine Fundus: fundus firm and 2 fb below umbilicus DVT Evaluation: no cords, ttp LEs, SCDs in place.   Recent Labs    10/22/20 1512 10/23/20 0636  HGB 9.1* 5.7*  HCT 26.3* 17.0*  WBC 20.5* 12.0*  PLT 337 175    Assessment/Plan: 25 y.o. G1P1001 postpartum day # 1  - Continue routine PP care - TED hose and encouraged ambulation, foley DC, shower later today and plan to place honeycomb dsg - Lactation consult prn - Acute blood loss anemia PRBC transfusion ordered, Lasix, tylenol and benadryl pre-medication given.  - hemodynamically stable and asymptomatic; start po ferrous sulfate BID with stool softeners  - Pre-E with severe features: Mag DC now, diuresing well for last 3hrs - Hypokalemia: on PO K+ supplement BID, given lasix  For pre-med, will increase dose of KDur supplement today.     Disposition: Does not desire Dc home today.     10/25/20, CNM 10/23/2020  9:45 AM

## 2020-10-24 ENCOUNTER — Encounter: Payer: Self-pay | Admitting: Obstetrics and Gynecology

## 2020-10-24 LAB — TYPE AND SCREEN
ABO/RH(D): O POS
Antibody Screen: NEGATIVE
Unit division: 0
Unit division: 0

## 2020-10-24 LAB — BPAM RBC
Blood Product Expiration Date: 202206132359
Blood Product Expiration Date: 202206132359
ISSUE DATE / TIME: 202205281111
ISSUE DATE / TIME: 202205281253
Unit Type and Rh: 5100
Unit Type and Rh: 5100

## 2020-10-24 LAB — BASIC METABOLIC PANEL
Anion gap: 7 (ref 5–15)
BUN: 12 mg/dL (ref 6–20)
CO2: 23 mmol/L (ref 22–32)
Calcium: 8.1 mg/dL — ABNORMAL LOW (ref 8.9–10.3)
Chloride: 108 mmol/L (ref 98–111)
Creatinine, Ser: 0.63 mg/dL (ref 0.44–1.00)
GFR, Estimated: >60 mL/min (ref >60)
Glucose, Bld: 84 mg/dL (ref 70–99)
Potassium: 4.2 mmol/L (ref 3.5–5.1)
Sodium: 138 mmol/L (ref 135–145)

## 2020-10-24 LAB — CBC
HCT: 22.2 % — ABNORMAL LOW (ref 36.0–46.0)
Hemoglobin: 7.9 g/dL — ABNORMAL LOW (ref 12.0–15.0)
MCH: 29.5 pg (ref 26.0–34.0)
MCHC: 35.6 g/dL (ref 30.0–36.0)
MCV: 82.8 fL (ref 80.0–100.0)
Platelets: 233 10*3/uL (ref 150–400)
RBC: 2.68 MIL/uL — ABNORMAL LOW (ref 3.87–5.11)
RDW: 13.8 % (ref 11.5–15.5)
WBC: 11 10*3/uL — ABNORMAL HIGH (ref 4.0–10.5)
nRBC: 0 % (ref 0.0–0.2)

## 2020-10-24 MED ORDER — SIMETHICONE 80 MG PO CHEW: 80.0000 mg | CHEWABLE_TABLET | Freq: Three times a day (TID) | ORAL | 0 refills | Status: AC

## 2020-10-24 MED ORDER — SENNOSIDES-DOCUSATE SODIUM 8.6-50 MG PO TABS: 2.0000 | ORAL_TABLET | Freq: Every day | ORAL | 0 refills | Status: AC

## 2020-10-24 MED ORDER — MEDROXYPROGESTERONE ACETATE 150 MG/ML IM SUSP
150.0000 mg | INTRAMUSCULAR | Status: DC
  Administered 2020-10-24: 150 mg via INTRAMUSCULAR
  Filled 2020-10-24: qty 1

## 2020-10-24 MED ORDER — IBUPROFEN 600 MG PO TABS: 600.0000 mg | ORAL_TABLET | Freq: Four times a day (QID) | ORAL | 0 refills | Status: AC | PRN

## 2020-10-24 MED ORDER — VARICELLA VIRUS VACCINE LIVE 1350 PFU/0.5ML IJ SUSR
0.5000 mL | Freq: Once | INTRAMUSCULAR | Status: AC
  Administered 2020-10-24: 0.5 mL via SUBCUTANEOUS
  Filled 2020-10-24 (×2): qty 0.5

## 2020-10-24 MED ORDER — FERROUS SULFATE 325 (65 FE) MG PO TABS: 325.0000 mg | ORAL_TABLET | Freq: Two times a day (BID) | ORAL | 1 refills | Status: AC

## 2020-10-24 MED ORDER — NIFEDIPINE ER OSMOTIC RELEASE 30 MG PO TB24
30.0000 mg | ORAL_TABLET | Freq: Every day | ORAL | Status: DC
  Administered 2020-10-24: 30 mg via ORAL
  Filled 2020-10-24: qty 1

## 2020-10-24 MED ORDER — NIFEDIPINE ER 30 MG PO TB24: 30.0000 mg | ORAL_TABLET | Freq: Every day | ORAL | 0 refills | Status: AC

## 2020-10-24 MED ORDER — COCONUT OIL OIL: 1.0000 "application " | TOPICAL_OIL | 0 refills | Status: AC | PRN

## 2020-10-24 MED ORDER — ACETAMINOPHEN 325 MG PO TABS: 650.0000 mg | ORAL_TABLET | Freq: Four times a day (QID) | ORAL | 0 refills | Status: AC | PRN

## 2020-10-24 MED ORDER — OXYCODONE HCL 5 MG PO TABS: 5.0000 mg | ORAL_TABLET | Freq: Four times a day (QID) | ORAL | 0 refills | Status: AC | PRN

## 2020-10-24 NOTE — Discharge Instructions (Signed)
Breast Pumping Tips Breast pumping is a way to get milk out of your breasts. You will then store the milk for your baby to use when you are away from home. There are three ways to pump.  You can use your hand to massage and squeeze your breast (hand expression).  You can use a hand-held machine to manually pump your milk.  You can use an electric machine to pump your milk. In the beginning you may not get much milk. After a few days, your breasts should make more. Pumping can help you start making milk after your baby is born. Pumping helps you to keep making milk when you are away from your baby. When should I pump? You can start pumping soon after your baby is born. Follow these tips:  When you are with your baby: ? Pump after you breastfeed. ? Pump from the free breast while you breastfeed.  When you are away from your baby: ? Pump every 2-3 hours for 15 minutes. ? Pump both breasts at the same time if you can.  If your baby drinks formula, pump around the time your baby gets the formula.  If you drank alcohol, wait 2 hours before you pump.  If you are going to have surgery, ask your doctor when you should pump again. How do I get ready to pump? Try to relax. Try these things to help your milk come in:  Smell your baby's blanket or clothes.  Look at a picture or video of your baby.  Sit in a quiet, private space.  Place a cloth on your breast. The cloth should be warm and a little wet.  Massage your breast and nipple.  Play relaxing music.  Picture your milk flowing.  Drink water and eat a snack. What are some tips? General tips for pumping breast milk  Always wash your hands with soap and water for at least 20 seconds before pumping.  If you do not get much milk or if pumping hurts, try different pump settings or a different kind of pump.  Drink enough fluid so your pee (urine) is clear or pale yellow.  Wear clothing that opens in the front or is easy to take  off.  Pump milk into a clean bottle or container.  Do not smoke or use any products that contain nicotine or tobacco. If you need help quitting, ask your doctor.  Try to get a hands-free pumping bra, if possible. This makes it easy to pump breast milk. You can buy one or make your own.   Tips for storing breast milk  Store breast milk in a clean, BPA-free container. These include: ? A glass or plastic bottle. ? A milk storage bag.  Store only 2-4 ounces of breast milk in each container.  Swirl the breast milk in the container. Do not shake it.  Write down the date you pumped the milk on the container.  This is how long you can store breast milk: ? Room temperature: 6-8 hours. It is best to use the milk within 4 hours. ? Cooler with ice packs: 24 hours. ? Refrigerator: 5-8 days, if the milk is clean. It is best to use the milk within 3 days. ? Freezer: 9-12 months, if the milk is clean and stored away from the freezer door. It is best to use the milk within 6 months.  Put milk in the back of the refrigerator or freezer.  Thaw frozen milk using warm water. Do not  use the microwave.   Tips for choosing a breast pump When choosing a pump, keep the following things in mind:  Manual breast pumps do not need electricity. They cost less. They can be hard to use.  Electric breast pumps use electricity. They are more expensive. They are easier to use. They collect more milk.  The suction cup (flange) should be the right size.  Before you buy the pump, check if your insurance will pay for it. Tips for caring for a breast pump  Check the manual that came with your pump for cleaning tips.  Try not to touch the inside of pump parts.  Clean the pump after you use it. To do this: ? Wipe down the electrical part. Use a dry cloth or paper towel. Do not put this part in water or in cleaning products. ? Wash the plastic parts with soap and warm water. Or use the dishwasher if the manual  says it is safe. You do not need to clean the tubing unless it touched breast milk. ? Let all the parts air dry. Avoid drying them with a cloth or towel. ? When the parts are clean and dry, put the pump back together. Then store the pump.  If there is water in the tubing when you want to pump: 1. Attach the tubing to the pump. 2. Turn on the pump to dry the tubing. 3. Turn off the pump when the tube is dry. Summary  Pumping can help you start making milk after your baby is born. It lets you keep making milk when you are away from your baby.  When you are away from your baby, pump for about 15 minutes every 2-3 hours. Pump both breasts at the same time, if you can. This information is not intended to replace advice given to you by your health care provider. Make sure you discuss any questions you have with your health care provider. Document Revised: 02/24/2020 Document Reviewed: 02/24/2020 Elsevier Patient Education  2021 [employer] Postpartum Care After Cesarean Delivery This sheet gives you information about how to care for yourself from the time you deliver your baby to up to 6-12 weeks after delivery (postpartum period). Your health care provider may also give you more specific instructions. If you have problems or questions, contact your health care provider. Follow these instructions at home: Medicines  Take over-the-counter and prescription medicines only as told by your health care provider.  If you were prescribed an antibiotic medicine, take it as told by your health care provider. Do not stop taking the antibiotic even if you start to feel better.  Ask your health care provider if the medicine prescribed to you: ? Requires you to avoid driving or using heavy machinery. ? Can cause constipation. You may need to take actions to prevent or treat constipation, such as:  Drink enough fluid to keep your urine pale yellow.  Take over-the-counter or prescription  medicines.  Eat foods that are high in fiber, such as beans, whole grains, and fresh fruits and vegetables.  Limit foods that are high in fat and processed sugars, such as fried or sweet foods. Activity  Gradually return to your normal activities as told by your health care provider.  Avoid activities that take a lot of effort and energy (are strenuous) until approved by your health care provider. Walking at a slow to moderate pace is usually safe. Ask your health care provider what activities are safe for you. ? Do not  lift anything that is heavier than your baby or 10 lb (4.5 kg) as told by your health care provider. ? Do not vacuum, climb stairs, or drive a car for as long as told by your health care provider.  If possible, have someone help you at home until you are able to do your usual activities yourself.  Rest as much as possible. Try to rest or take naps while your baby is sleeping. Vaginal bleeding  It is normal to have vaginal bleeding (lochia) after delivery. Wear a sanitary pad to absorb vaginal bleeding and discharge. ? During the first week after delivery, the amount and appearance of lochia is often similar to a menstrual period. ? Over the next few weeks, it will gradually decrease to a dry, yellow-brown discharge. ? For most women, lochia stops completely by 4-6 weeks after delivery. Vaginal bleeding can vary from woman to woman.  Change your sanitary pads frequently. Watch for any changes in your flow, such as: ? A sudden increase in volume. ? A change in color. ? Large blood clots.  If you pass a blood clot, save it and call your health care provider to discuss. Do not flush blood clots down the toilet before you get instructions from your health care provider.  Do not use tampons or douches until your health care provider says this is safe.  If you are not breastfeeding, your period should return 6-8 weeks after delivery. If you are breastfeeding, your period may  return anytime between 8 weeks after delivery and the time that you stop breastfeeding. Perineal care  If your C-section (Cesarean section) was unplanned, and you were allowed to labor and push before delivery, you may have pain, swelling, and discomfort of the tissue between your vaginal opening and your anus (perineum). You may also have an incision in the tissue (episiotomy) or the tissue may have torn during delivery. Follow these instructions as told by your health care provider: ? Keep your perineum clean and dry as told by your health care provider. Use medicated pads and pain-relieving sprays and creams as directed. ? If you have an episiotomy or vaginal tear, check the area every day for signs of infection. Check for:  Redness, swelling, or pain.  Fluid or blood.  Warmth.  Pus or a bad smell. ? You may be given a squirt bottle to use instead of wiping to clean the perineum area after you go to the bathroom. As you start healing, you may use the squirt bottle before wiping yourself. Make sure to wipe gently. ? To relieve pain caused by an episiotomy, vaginal tear, or hemorrhoids, try taking a warm sitz bath 2-3 times a day. A sitz bath is a warm water bath that is taken while you are sitting down. The water should only come up to your hips and should cover your buttocks.   Breast care  Within the first few days after delivery, your breasts may feel heavy, full, and uncomfortable (breast engorgement). You may also have milk leaking from your breasts. Your health care provider can suggest ways to help relieve breast discomfort. Breast engorgement should go away within a few days.  If you are breastfeeding: ? Wear a bra that supports your breasts and fits you well. ? Keep your nipples clean and dry. Apply creams and ointments as told by your health care provider. ? You may need to use breast pads to absorb milk leakage. ? You may have uterine contractions every time you breastfeed  for  several weeks after delivery. Uterine contractions help your uterus return to its normal size. ? If you have any problems with breastfeeding, work with your health care provider or a Advertising copywriter.  If you are not breastfeeding: ? Avoid touching your breasts as this can make your breasts produce more milk. ? Wear a well-fitting bra and use cold packs to help with swelling. ? Do not squeeze out (express) milk. This causes you to make more milk. Intimacy and sexuality  Ask your health care provider when you can engage in sexual activity. This may depend on your: ? Risk of infection. ? Healing rate. ? Comfort and desire to engage in sexual activity.  You are able to get pregnant after delivery, even if you have not had your period. If desired, talk with your health care provider about methods of family planning or birth control (contraception). Lifestyle  Do not use any products that contain nicotine or tobacco, such as cigarettes, e-cigarettes, and chewing tobacco. If you need help quitting, ask your health care provider.  Do not drink alcohol, especially if you are breastfeeding. Eating and drinking  Drink enough fluid to keep your urine pale yellow.  Eat high-fiber foods every day. These may help prevent or relieve constipation. High-fiber foods include: ? Whole grain cereals and breads. ? Brown rice. ? Beans. ? Fresh fruits and vegetables.  Take your prenatal vitamins until your postpartum checkup or until your health care provider tells you it is okay to stop.   General instructions  Keep all follow-up visits for you and your baby as told by your health care provider. Most women visit their health care provider for a postpartum checkup within the first 3-6 weeks after delivery. Contact a health care provider if you:  Feel unable to cope with the changes that a new baby brings to your life, and these feelings do not go away.  Feel unusually sad or worried.  Have  breasts that are painful, hard, or turn red.  Have a fever.  Have trouble holding urine or keeping urine from leaking.  Have little or no interest in activities you used to enjoy.  Have not breastfed at all and you have not had a menstrual period for 12 weeks after delivery.  Have stopped breastfeeding and you have not had a menstrual period for 12 weeks after you stopped breastfeeding.  Have questions about caring for yourself or your baby.  Pass a blood clot from your vagina. Get help right away if you:  Have chest pain.  Have difficulty breathing.  Have sudden, severe leg pain.  Have severe pain or cramping in your abdomen.  Bleed from your vagina so much that you fill more than one sanitary pad in one hour. Bleeding should not be heavier than your heaviest period.  Develop a severe headache.  Faint.  Have blurred vision or spots in your vision.  Have a bad-smelling vaginal discharge.  Have thoughts about hurting yourself or your baby. If you ever feel like you may hurt yourself or others, or have thoughts about taking your own life, get help right away. You can go to your nearest emergency department or call:  Your local emergency services (911 in the [place]  A suicide crisis helpline, such as the National Suicide Prevention Lifeline at (579)318-6568. This is open 24 hours a day. Summary  The period of time from when you deliver your baby to up to 6-12 weeks after delivery is called the postpartum period.  Gradually return to your normal activities as told by your health care provider.  Keep all follow-up visits for you and your baby as told by your health care provider. This information is not intended to replace advice given to you by your health care provider. Make sure you discuss any questions you have with your health care provider. Document Revised: 01/02/2018 Document Reviewed: 01/02/2018 Elsevier Patient Education  2021 [employer] Postpartum Baby  Blues The postpartum period begins right after the birth of a baby. During this time, there is often joy and excitement. It is also a time of many changes in the life of the parents. A mother may feel happy one minute and sad or stressed the next. These feelings of sadness, called the baby blues, usually happen in the period right after the baby is born and go away within a week or two. What are the causes? The exact cause of this condition is not known. Changes in hormone levels after childbirth are believed to trigger some of the symptoms. Other factors that can play a role in these mood changes include:  Lack of sleep.  Stressful life events, such as financial problems, caring for a loved one, or death of a loved one.  Genetics. What are the signs or symptoms? Symptoms of this condition include:  Changes in mood, such as going from extreme happiness to sadness.  A decrease in concentration.  Difficulty sleeping.  Crying spells and tearfulness.  Loss of appetite.  Irritability.  Anxiety. If these symptoms last for more than 2 weeks or become more severe, you may have postpartum depression. How is this diagnosed? This condition is diagnosed based on an evaluation of your symptoms. Your health care provider may use a screening tool that includes a list of questions to help identify a person with the baby blues or postpartum depression. How is this treated? The baby blues usually go away on their own in 1-2 weeks. Social support is often what is needed. You will be encouraged to get adequate sleep and rest. Follow these instructions at home: Lifestyle  Get as much rest as you can. Take a nap when the baby sleeps.  Exercise regularly as told by your health care provider. Some women find yoga and walking to be helpful.  Eat a balanced and nourishing diet. This includes plenty of fruits and vegetables, whole grains, and lean proteins.  Do little things that you enjoy. Take a  bubble bath, read your favorite magazine, or listen to your favorite music.  Avoid alcohol.  Ask for help with household chores, cooking, grocery shopping, or running errands. Do not try to do everything yourself. Consider hiring a postpartum doula to help. This is a professional who specializes in providing support to new mothers.  Try not to make any major life changes during pregnancy or right after giving birth. This can add stress.      General instructions  Talk to people close to you about how you are feeling. Get support from your partner, family members, friends, or other new moms. You may want to join a support group.  Find ways to manage stress. This may include: ? Writing your thoughts and feelings in a journal. ? Spending time outside. ? Spending time with people who make you laugh.  Try to stay positive in how you think. Think about the things you are grateful for.  Take over-the-counter and prescription medicines only as told by your health care provider.  Let your health care  provider know if you have any concerns.  Keep all postpartum visits. This is important. Contact a health care provider if:  Your baby blues do not go away after 2 weeks. Get help right away if:  You have thoughts of taking your own life (suicidal thoughts), or of harming your baby or someone else.  You see or hear things that are not there (hallucinations). If you ever feel like you may hurt yourself or others, or have thoughts about taking your own life, get help right away. Go to your nearest emergency department or:  Call your local emergency services (911 in the [place]  Call a suicide crisis helpline, such as the National Suicide Prevention Lifeline, at (225) 808-3841. This is open 24 hours a day in the [place]  Text the Crisis Text Line at 956-657-0136 (in the [place] Summary  After giving birth, you may feel happy one minute and sad or stressed the next. Feelings of sadness that happen right  after the baby is born and go away after a week or two are called the baby blues.  You can manage the baby blues by getting enough rest, eating a healthy diet, exercising, spending time with supportive people, and finding ways to manage stress.  If feelings of sadness and stress last longer than 2 weeks or get in the way of caring for your baby, talk with your health care provider. This may mean you have postpartum depression. This information is not intended to replace advice given to you by your health care provider. Make sure you discuss any questions you have with your health care provider. Document Revised: 11/07/2019 Document Reviewed: 11/07/2019 Elsevier Patient Education  2021 [employer] Breastfeeding Tips for a Good Latch Latching is how your baby's mouth attaches to your nipple to breastfeed. It is an important part of breastfeeding. Your baby may have trouble latching for a number of reasons, such as:  Not being in the right position.  Using a bottle or pacifier too early.  Problems within your baby's mouth, tongue, or lips.  The shape of your nipples.  Your baby being born early (prematurely). Small babies often have a weak suck.  Breasts becoming overfilled with milk (engorged breasts).  Express a little milk to help soften the breast. Work with a breastfeeding specialist (Advertising copywriter) to help your baby have a good latch. How does this affect me? A poor latch may cause you to have problems such as:  Cracked nipples.  Sore nipples.  Breasts becoming overfilled with milk  Plugged milk ducts.  Low milk supply.  Breast inflammation.  Breast infection. How does this affect my baby? A poor latch may cause your baby to not be able to feed well. As a result, he or she may have trouble gaining weight. Follow these instructions at home: How to position your baby  Find a comfortable place to sit or lie down. Your neck and back should be well supported.  If  you are seated, place a pillow or rolled-up blanket under your baby. This will bring him or her to the level of your breast.  Make sure that your baby's belly is facing your belly.  Try different positions to find one that works best for you and your baby. How to help your baby latch  To start, you might find it helpful to gently rub your breast. Move your fingertips in a circle as you massage from your chest wall toward your nipple. This helps milk flow. Keep doing this  during feeding if needed.  Position your breast. Hold your breast with four fingers underneath and your thumb above your nipple. Keep your fingers away from your nipple and your baby's mouth. Follow these steps to help your baby latch: 1. Rub your baby's lips gently with your finger or nipple. 2. When your baby's mouth is open wide enough, quickly bring your baby to your breast and place your whole nipple into your baby's mouth. Place as much of the colored area around your nipple (areola)as possible into your baby's mouth. 3. Your baby's tongue should be between his or her lower gum and your breast. 4. You should be able to see more areola above your baby's upper lip than below the lower lip. 5. When your baby starts sucking, you will feel a gentle pull on your nipple. You should not feel any pain. Be patient. It is common for a baby to suck for about 2-3 minutes to start the flow of breast milk. 6. Make sure that your baby's mouth is in the right position around your nipple. Your baby's lips should make a seal on your breast and be turned outward.   General instructions  Look for these signs that your baby has latched on to your nipple: ? The baby is quietly tugging or sucking without causing you pain. ? You hear the baby swallow after every 3 or 4 sucks. ? You see movement above and in front of the baby's ears while he or she is sucking.  Be aware of these signs that your baby has not latched on to your nipple: ? The baby  makes sucking sounds or smacking sounds while feeding. ? You have nipple pain.  If your baby is not latched well, put your little finger between your baby's gums and your nipple. This will break the seal. Then try to help your baby latch again.  If you need help, get help from a breastfeeding specialist. Contact a doctor if:  You have cracking or soreness in your nipples that lasts longer than 1 week.  You have nipple pain.  Your breasts are filled with too much milk (engorgement), and this does not improve after 48-72 hours.  You have a plugged milk duct and a fever.  You follow the tips for a good latch but need more help.  You have a pus-like fluid coming from your breast.  Your baby is not gaining weight.  Your baby loses weight. Summary  Latching is how your baby's mouth attaches to your nipple to breastfeed.  Try different positions for breastfeeding to find one that works best for you and your baby.  A poor latch may cause you to have cracked or sore nipples or other problems.  Work with a breastfeeding specialist (Advertising copywriter) to help your baby have a good latch. This information is not intended to replace advice given to you by your health care provider. Make sure you discuss any questions you have with your health care provider. Document Revised: 11/12/2019 Document Reviewed: 11/12/2019 Elsevier Patient Education  2021 [employer] Breast Pumping Tips Breast pumping is a way to get milk out of your breasts. You will then store the milk for your baby to use when you are away from home. There are three ways to pump.  You can use your hand to massage and squeeze your breast (hand expression).  You can use a hand-held machine to manually pump your milk.  You can use an electric machine to pump your milk.  In the beginning you may not get much milk. After a few days, your breasts should make more. Pumping can help you start making milk after your baby is  born. Pumping helps you to keep making milk when you are away from your baby. When should I pump? You can start pumping soon after your baby is born. Follow these tips:  When you are with your baby: ? Pump after you breastfeed. ? Pump from the free breast while you breastfeed.  When you are away from your baby: ? Pump every 2-3 hours for 15 minutes. ? Pump both breasts at the same time if you can.  If your baby drinks formula, pump around the time your baby gets the formula.  If you drank alcohol, wait 2 hours before you pump.  If you are going to have surgery, ask your doctor when you should pump again. How do I get ready to pump? Try to relax. Try these things to help your milk come in:  Smell your baby's blanket or clothes.  Look at a picture or video of your baby.  Sit in a quiet, private space.  Place a cloth on your breast. The cloth should be warm and a little wet.  Massage your breast and nipple.  Play relaxing music.  Picture your milk flowing.  Drink water and eat a snack. What are some tips? General tips for pumping breast milk  Always wash your hands with soap and water for at least 20 seconds before pumping.  If you do not get much milk or if pumping hurts, try different pump settings or a different kind of pump.  Drink enough fluid so your pee (urine) is clear or pale yellow.  Wear clothing that opens in the front or is easy to take off.  Pump milk into a clean bottle or container.  Do not smoke or use any products that contain nicotine or tobacco. If you need help quitting, ask your doctor.  Try to get a hands-free pumping bra, if possible. This makes it easy to pump breast milk. You can buy one or make your own.   Tips for storing breast milk  Store breast milk in a clean, BPA-free container. These include: ? A glass or plastic bottle. ? A milk storage bag.  Store only 2-4 ounces of breast milk in each container.  Swirl the breast milk in the  container. Do not shake it.  Write down the date you pumped the milk on the container.  This is how long you can store breast milk: ? Room temperature: 6-8 hours. It is best to use the milk within 4 hours. ? Cooler with ice packs: 24 hours. ? Refrigerator: 5-8 days, if the milk is clean. It is best to use the milk within 3 days. ? Freezer: 9-12 months, if the milk is clean and stored away from the freezer door. It is best to use the milk within 6 months.  Put milk in the back of the refrigerator or freezer.  Thaw frozen milk using warm water. Do not use the microwave.   Tips for choosing a breast pump When choosing a pump, keep the following things in mind:  Manual breast pumps do not need electricity. They cost less. They can be hard to use.  Electric breast pumps use electricity. They are more expensive. They are easier to use. They collect more milk.  The suction cup (flange) should be the right size.  Before you buy the pump, check  if your insurance will pay for it. Tips for caring for a breast pump  Check the manual that came with your pump for cleaning tips.  Try not to touch the inside of pump parts.  Clean the pump after you use it. To do this: ? Wipe down the electrical part. Use a dry cloth or paper towel. Do not put this part in water or in cleaning products. ? Wash the plastic parts with soap and warm water. Or use the dishwasher if the manual says it is safe. You do not need to clean the tubing unless it touched breast milk. ? Let all the parts air dry. Avoid drying them with a cloth or towel. ? When the parts are clean and dry, put the pump back together. Then store the pump.  If there is water in the tubing when you want to pump: 1. Attach the tubing to the pump. 2. Turn on the pump to dry the tubing. 3. Turn off the pump when the tube is dry. Summary  Pumping can help you start making milk after your baby is born. It lets you keep making milk when you are away  from your baby.  When you are away from your baby, pump for about 15 minutes every 2-3 hours. Pump both breasts at the same time, if you can. This information is not intended to replace advice given to you by your health care provider. Make sure you discuss any questions you have with your health care provider. Document Revised: 02/24/2020 Document Reviewed: 02/24/2020 Elsevier Patient Education  2021 [employer].

## 2020-10-24 NOTE — Progress Notes (Addendum)
Post Partum Day/POD# 2  Subjective: Doing well, no complaints.  Tolerating regular diet, pain with PO meds. Some incisional discomfort, but manageable. Voiding without issues.   No CP SOB Fever,Chills, N/V or leg pain; denies nipple or breast pain, no HA change of vision, RUQ/epigastric pain  Desires DC home today.    Objective: BP 135/77 (BP Location: Right Arm)   Pulse 91   Temp 98.9 F (37.2 C) (Oral)   Resp 20   Ht 5\' 9"  (1.753 m)   Wt 125.2 kg   LMP 01/03/2020 (Approximate) Comment: normal  SpO2 98%   Breastfeeding Unknown   BMI 40.76 kg/m   BP 130-140/80-90    Intake/Output Summary (Last 24 hours) at 10/24/2020 0859 Last data filed at 10/24/2020 0400 Gross per 24 hour  Intake 1642.33 ml  Output 1525 ml  Net 117.33 ml     Physical Exam:  General: NAD Breasts: soft/nontender CV: RRR Pulm: nl effort, CTABL Abdomen: soft, NT, BS x 4 Incision: honeycomb dsg CDI, no drainage Lochia: small Uterine Fundus: fundus firm and 2 fb below umbilicus DVT Evaluation: no cords, ttp LEs, SCDs in place.   Recent Labs    10/23/20 0636 10/23/20 1806 10/24/20 0358  HGB 5.7* 8.3* 7.9*  HCT 17.0* 23.3* 22.2*  WBC 12.0*  --  11.0*  PLT 175  --  233    Assessment/Plan: 25 y.o. G1P1001 postpartum day # 2  - Continue routine PP care - TED hose and encouraged ambulation - Lactation consult prn - Acute blood loss anemia PRBC transfusion given yesterday.   - hemodynamically stable and asymptomatic; start po ferrous sulfate BID with stool softeners   - Pre-E with severe features: no severe BP since delivery. Meets criteria for PP antihypertensive therapy, start Procardia 30mg  XL daily now. Reviewed BP to call about, will need close F/u PP for BP check.  - Hypokalemia: resolved, dc PO supplementation, encouraged potassium rich foods.     Disposition: Does desire Dc home today.     10/26/20, CNM 10/24/2020  8:58 AM

## 2020-10-24 NOTE — Progress Notes (Signed)
Patient discharged home with infant. Rx sent to pharmacy. PP instructions given and reviewed with patient. Patient is aware to check BP at home and record them. She understands to call if BP is persistently 140/90 or call immediately if 160-110 per provider. She is aware of Pre-E s/s. Patient verbalized understanding. Patient escorted out by auxillary.  Peter Minium 10/24/2020 3:39 PM

## 2020-10-25 ENCOUNTER — Other Ambulatory Visit: Payer: Self-pay | Admitting: Obstetrics and Gynecology

## 2020-10-25 NOTE — Progress Notes (Signed)
Pt called with reported elevated BP this evening 158/99, denies HA, RUQ pain or VD. Has not taken BP medication as rx,Pt did not pickup meds at pharmacy on discharge due to pharmacy closed already.    Multiple pharmacies called, closed due to [facility] day holiday. Able to reach [facility], open until 6pm. Called pt back and notified Rx has been called in and closing time.   Randa Ngo, CNM 10/25/2020

## 2020-10-26 LAB — SURGICAL PATHOLOGY

## 2020-10-29 ENCOUNTER — Ambulatory Visit: Payer: Self-pay

## 2020-10-29 ENCOUNTER — Telehealth: Payer: Self-pay

## 2020-10-29 NOTE — Telephone Encounter (Signed)
TC to patient to reschedule missed appointment for BP check today. LM with number to call.Burt Knack, RN

## 2020-11-01 NOTE — Telephone Encounter (Signed)
Phone call to pt. Pt rescheduled for 11/03/20 in the afternoon for BP check in provider clinic.

## 2020-11-03 ENCOUNTER — Other Ambulatory Visit: Payer: Self-pay

## 2020-11-03 ENCOUNTER — Ambulatory Visit: Payer: Medicaid Other | Admitting: Advanced Practice Midwife

## 2020-11-03 ENCOUNTER — Encounter: Payer: Self-pay | Admitting: Advanced Practice Midwife

## 2020-11-03 DIAGNOSIS — O1490 Unspecified pre-eclampsia, unspecified trimester: Secondary | ICD-10-CM

## 2020-11-03 DIAGNOSIS — O1495 Unspecified pre-eclampsia, complicating the puerperium: Secondary | ICD-10-CM

## 2020-11-03 LAB — URINALYSIS
Bilirubin, UA: NEGATIVE
Glucose, UA: NEGATIVE
Ketones, UA: NEGATIVE
Nitrite, UA: NEGATIVE
Specific Gravity, UA: 1.02 (ref 1.005–1.030)
Urobilinogen, Ur: 0.2 mg/dL (ref 0.2–1.0)
pH, UA: 5.5 (ref 5.0–7.5)

## 2020-11-03 NOTE — Progress Notes (Signed)
Patient has appointment 6/10 at 3:45 at [facility] for blood pressure follow up. Advised patient to have another follow up with [facility] regarding BP.   Patient reminder card given to make ~6 week postpartum follow up appointment and depo administration for 01/10/21. Per patient and care everywhere patient received Depo during hospital admission on 10/24/20.   Urine dip reviewed with provider, Hazle Coca, CNM during clinic visit.   Floy Sabina, RN

## 2020-11-03 NOTE — Progress Notes (Signed)
25 yo SHF G1P1 12 days pp here for BP check Sent to L&D from prenatal apt with BP: 160/93, 163/90, 153/91, 149/86 for IOL x 3 days with cytotec, pti, AROM, foley bulb for preeclampsia with severe features.  IV MgSo4. Chorio. LTCS for FTP on 10/24/20 at 40 2/7 with PPH and 2 UPRBC. M 7#15. S: Feels pretty good except feels flushed with h/a after taking Nifedipine 30 mg BID (d/c home on this and KC then told her to take BID.  She has an apt with KC on 11/05/20. Her cousin is helping her with baby and also has been pumping and giving baby her milk and her cousin's milk q 2 hours (2 oz).  Red lochia.   O: 120/73.  Pt has not removed honeycomb dressing on day 5 yet.  Dressing removed A:  12 days post LTCS on Nifedipine 30 mg BID; 1+ proteinuria with 3+ blood       Incision healing well, intact, well approximated, no signs of infection P:  Pt to keep KC apt 11/05/20 for BP check       Couonseled pt to take vits daily

## 2020-11-03 NOTE — Progress Notes (Signed)
Patient here day 12 post partum for blood pressure check.   BP today 120/73. HR 82. Weight 238 lbs. Temp 99.97F.   Patient states she is taking Nifedipine 30mg  BID.   Counseled patient to take PNV vitamins as she stated she stopped taking them.   , RN

## 2020-12-10 ENCOUNTER — Telehealth: Payer: Self-pay

## 2020-12-10 NOTE — Telephone Encounter (Signed)
TC to patient to schedule PP appointment. LM with number to call.Zarai Orsborn Brewer-Jensen, RN  

## 2020-12-13 NOTE — Telephone Encounter (Signed)
Call to client to schedule post-partum appt and left message to call with number provided. Jossie Ng, RN

## 2020-12-16 NOTE — Telephone Encounter (Signed)
Phone call to pt. Left message that RN with [facility] is calling to schedule PP appointment. Please call us back to schedule, [phone].

## 2020-12-21 NOTE — Telephone Encounter (Signed)
Per client, phone was broken and  now has new one. Client has scheduled post-partum appt for tomorrow, 12/22/2020. Jossie Ng, RN

## 2020-12-22 ENCOUNTER — Telehealth: Payer: Self-pay

## 2020-12-22 ENCOUNTER — Ambulatory Visit: Payer: Medicaid Other

## 2020-12-22 NOTE — Telephone Encounter (Signed)
[facility] for post-partum 12/22/2020. Call to client and left message requesting she reschedule her appt. Number to call provided. Jossie Ng, RN

## 2020-12-23 NOTE — Telephone Encounter (Signed)
TC to patient to try to reschedule her missed PP appointment. LM with number to call.Burt Knack, RN

## 2020-12-27 NOTE — Telephone Encounter (Signed)
Call to client to reschedule missed post-partum appt. Left message to call with number to call provided. Jossie Ng, RN

## 2021-01-12 ENCOUNTER — Telehealth: Payer: Self-pay

## 2021-01-12 NOTE — Telephone Encounter (Signed)
TC to patient to reschedule missed PP appointment. Patient now rescheduled for 01/19/2021, and desires Depo for [facility].Marland KitchenBurt Knack, RN

## 2021-01-19 ENCOUNTER — Ambulatory Visit: Payer: Medicaid Other

## 2021-02-16 ENCOUNTER — Encounter: Payer: Self-pay | Admitting: Family Medicine

## 2021-02-16 ENCOUNTER — Other Ambulatory Visit: Payer: Self-pay

## 2021-02-16 ENCOUNTER — Ambulatory Visit: Payer: Medicaid Other | Admitting: Family Medicine

## 2021-02-16 DIAGNOSIS — Z30013 Encounter for initial prescription of injectable contraceptive: Secondary | ICD-10-CM | POA: Diagnosis not present

## 2021-02-16 DIAGNOSIS — Z3009 Encounter for other general counseling and advice on contraception: Secondary | ICD-10-CM

## 2021-02-16 MED ORDER — MEDROXYPROGESTERONE ACETATE 150 MG/ML IM SUSP
150.0000 mg | INTRAMUSCULAR | Status: AC
  Administered 2021-02-16: 150 mg via INTRAMUSCULAR

## 2021-02-16 NOTE — Progress Notes (Signed)
Pt here for a PP and BC.  Depo 150 mg given IM in Lt deltoid without any complications.  Pt given reminder card to return in 11-13 weeks for next Depo injection. Berdie Ogren, RN

## 2021-02-16 NOTE — Progress Notes (Signed)
Post Partum Exam  Krista Huffman is a 25 y.o. G9P1001 female who presents for a postpartum visit. She is  16  weeks postpartum following a low cervical transverse Cesarean section. I have fully reviewed the prenatal and intrapartum course. The delivery was at 40W gestational weeks.  Anesthesia: epidural and surgical dosing . Postpartum course has been getting better, it was hard in the beginning. Baby's course has been adapting well, a very happy baby. Baby is feeding by  bottle now, was doing breast and bottle.   Bleeding no bleeding. Bowel function is normal. Bladder function is normal. Patient is sexually active. Contraception method is Depo-Provera injections.   Postpartum depression screening:    The following portions of the patient's history were reviewed and updated as appropriate: allergies, current medications, past family history, past medical history, past social history, past surgical history, and problem list. Last pap smear done 03/22/2020 and was Normal  Review of Systems Pertinent items are noted in HPI.    Objective:  BP 134/82   Ht 5\' 9"  (1.753 m)   Wt 238 lb 3.2 oz (108 kg)   Breastfeeding No   BMI 35.18 kg/m   Gen: well appearing, NAD HEENT: no scleral icterus CV: RR Lung: Normal WOB Breast:performed-not indicated  Ext: warm well perfused  GU:not indicated  Rectal: performed -  not indicated       Assessment:     postpartum exam. Pap smear not done at today's visit.   Plan:   Essential components of care per ACOG recommendations for Comprehensive Postpartum exam:  1.  Mood and well being: Patient with negative depression screening today. Reviewed local resources for support. EPDS is low risk. Reviewed resources and that mood sx in first year after pregnancy are considered related to pregnancy and to reach out for help at [facility] if needed. Discussed [facility] as link to care and availability of LCSW for counseling  - Patient does not use tobacco.  - hx of  drug use? Yes  uses marijuana outside of home away from baby.  If yes, discussed support systems in place  2. Infant care and feeding:  -Patient currently breastmilk feeding?    -Recommended patient engage with WIC/BFpeer counselors  -Counseled to sign new child up for [facility] services -Social determinants of health (SDOH) reviewed in EPIC. No concerns for patient or baby, FOB is supportive. No needs were identified  3. Sexuality, contraception and birth spacing  Contraception: Contraception counseling: Reviewed all forms of birth control options in the tiered based approach. available including abstinence; over the counter/barrier methods; hormonal contraceptive medication including pill, patch, ring, injection,contraceptive implant; hormonal and nonhormonal IUDs; permanent sterilization options including vasectomy and the various tubal sterilization modalities. Risks, benefits, and typical effectiveness rates were reviewed.  Questions were answered.  Written information was also given to the patient to review.  Patient desires depo provera , this was prescribed for patient. She will follow up in  11-13 weeks  for surveillance.  She was told to call with any further questions, or with any concerns about this method of contraception.  Emphasized use of condoms 100% of the time for STI prevention.  Patient was not offered ECP based on last sex was ~6 weeks ago.   - Patient does not want a pregnancy in the next year.  Desired family size is 1 children.  - Reviewed forms of contraception in tiered fashion. Patient desired Depo-Provera today.   - Discussed birth spacing of 18 months  4. Sleep and fatigue -Encouraged family/partner/community support of 4 hrs of uninterrupted sleep to help with mood and fatigue  5. Physical Recovery  - Discussed patients delivery and complications Pt has PP Hemorrhage 2 Units of RBC's given.  Pt was inpatient for 6 days.  - Patient had no vaginal  lacerations d/t  cesarean delivery, wound healing reviewed.  - Patient has urinary incontinence? No - Patient is safe to resume physical and sexual activity  6.  Health Maintenance/Chronic Disease Health Maintenance Due  Topic Date Due   COVID-19 Vaccine (1) Never done   HPV VACCINES (1 - 2-dose series) Never done   INFLUENZA VACCINE  12/27/2020    - Last pap smear performed 02/2020 and was normal.   Not time for Mammogram  1. Postpartum examination following cesarean delivery  - pt doing well and denies any concerns at this time.    2. Encounter for initial prescription of injectable contraceptive  - medroxyPROGESTERone (DEPO-PROVERA) injection 150 mg  3. Family planning counseling Discussed birth spacing for 18 months between pregnancies    Patient given handout about PCP care in the community Given MVI per family planning program guidelines and availability  Follow up in:  11-13 weeks    for depo  or as needed.

## 2023-09-09 ENCOUNTER — Encounter (HOSPITAL_COMMUNITY): Payer: Self-pay

## 2023-09-09 ENCOUNTER — Other Ambulatory Visit: Payer: Self-pay

## 2023-09-09 ENCOUNTER — Emergency Department (HOSPITAL_COMMUNITY)

## 2023-09-09 ENCOUNTER — Emergency Department (HOSPITAL_COMMUNITY)
Admission: EM | Admit: 2023-09-09 | Discharge: 2023-09-09 | Disposition: A | Discharge status: Home / Self Care | Attending: Emergency Medicine | Admitting: Emergency Medicine

## 2023-09-09 DIAGNOSIS — R1032 Left lower quadrant pain: Secondary | ICD-10-CM | POA: Diagnosis present

## 2023-09-09 DIAGNOSIS — D72829 Elevated white blood cell count, unspecified: Secondary | ICD-10-CM | POA: Diagnosis not present

## 2023-09-09 LAB — COMPREHENSIVE METABOLIC PANEL WITH GFR
ALT: 33 U/L (ref 0–44)
AST: 32 U/L (ref 15–41)
Albumin: 4.3 g/dL (ref 3.5–5.0)
Alkaline Phosphatase: 39 U/L (ref 38–126)
Anion gap: 11 (ref 5–15)
BUN: 12 mg/dL (ref 6–20)
CO2: 21 mmol/L — ABNORMAL LOW (ref 22–32)
Calcium: 9.7 mg/dL (ref 8.9–10.3)
Chloride: 105 mmol/L (ref 98–111)
Creatinine, Ser: 0.83 mg/dL (ref 0.44–1.00)
GFR, Estimated: >60 mL/min (ref >60)
Glucose, Bld: 118 mg/dL — ABNORMAL HIGH (ref 70–99)
Potassium: 3.6 mmol/L (ref 3.5–5.1)
Sodium: 137 mmol/L (ref 135–145)
Total Bilirubin: 0.8 mg/dL (ref 0.0–1.2)
Total Protein: 7.9 g/dL (ref 6.5–8.1)

## 2023-09-09 LAB — LIPASE, BLOOD: Lipase: 25 U/L (ref 11–51)

## 2023-09-09 LAB — CBC
HCT: 38.8 % (ref 36.0–46.0)
Hemoglobin: 13.3 g/dL (ref 12.0–15.0)
MCH: 29 pg (ref 26.0–34.0)
MCHC: 34.3 g/dL (ref 30.0–36.0)
MCV: 84.7 fL (ref 80.0–100.0)
Platelets: 355 10*3/uL (ref 150–400)
RBC: 4.58 MIL/uL (ref 3.87–5.11)
RDW: 11.9 % (ref 11.5–15.5)
WBC: 17.1 10*3/uL — ABNORMAL HIGH (ref 4.0–10.5)
nRBC: 0 % (ref 0.0–0.2)

## 2023-09-09 LAB — URINALYSIS, ROUTINE W REFLEX MICROSCOPIC
Bacteria, UA: NONE SEEN
Bilirubin Urine: NEGATIVE
Glucose, UA: NEGATIVE mg/dL
Ketones, ur: 5 mg/dL — AB
Nitrite: POSITIVE — AB
Protein, ur: 100 mg/dL — AB
RBC / HPF: >50 RBC/hpf (ref 0–5)
Specific Gravity, Urine: 1.016 (ref 1.005–1.030)
WBC, UA: >50 WBC/hpf (ref 0–5)
pH: 6 (ref 5.0–8.0)

## 2023-09-09 LAB — HCG, SERUM, QUALITATIVE: Preg, Serum: NEGATIVE

## 2023-09-09 MED ORDER — CEPHALEXIN 500 MG PO CAPS: 500.0000 mg | ORAL_CAPSULE | Freq: Four times a day (QID) | ORAL | 0 refills | Status: AC

## 2023-09-09 MED ORDER — DOXYCYCLINE HYCLATE 100 MG PO CAPS
100.0000 mg | ORAL_CAPSULE | Freq: Two times a day (BID) | ORAL | 0 refills | Status: AC
Start: 2023-09-09 — End: 2023-09-16

## 2023-09-09 MED ORDER — IOHEXOL 350 MG/ML SOLN
75.0000 mL | Freq: Once | INTRAVENOUS | Status: AC | PRN
  Administered 2023-09-09: 75 mL via INTRAVENOUS

## 2023-09-09 MED ORDER — SODIUM CHLORIDE 0.9 % IV SOLN
2.0000 g | Freq: Once | INTRAVENOUS | Status: AC
  Administered 2023-09-09: 2 g via INTRAVENOUS
  Filled 2023-09-09: qty 20

## 2023-09-09 NOTE — ED Provider Notes (Signed)
 [place] [facility] Provider Note   CSN: [id number] Arrival date & time: 09/09/23  1603     History  Chief Complaint  Patient presents with   Abdominal Pain    Krista Huffman is a 28 y.o. female with no significant medical history presents with 1 day of lower abdominal pain.  Patient states she woke up this morning with suprapubic and lower left-sided abdominal discomfort.  She also states she has some "vaginal soreness.  "Denies any fevers but does endorse chills.  States that she has noticed blood-tinged urine but denies any burning with urination.  Endorses intermittent left-sided flank pain.  Denies any history of kidney stones.  Denies any nausea or vomiting.  Has had prior C-sections but no other surgical abdominal history.  No history of UTIs.  Has had STDs in the past and has multiple sexual partners.  Unsure if she could be pregnant.   Abdominal Pain      Home Medications Prior to Admission medications   Medication Sig Start Date End Date Taking? Authorizing Provider  cephALEXin (KEFLEX) 500 MG capsule Take 1 capsule (500 mg total) by mouth 4 (four) times daily for 7 days. 09/09/23 09/16/23 Yes Lorain Robson, MD  doxycycline (VIBRAMYCIN) 100 MG capsule Take 1 capsule (100 mg total) by mouth 2 (two) times daily for 7 days. 09/09/23 09/16/23 Yes Lorain Robson, MD  acetaminophen (TYLENOL) 325 MG tablet Take 2 tablets (650 mg total) by mouth every 6 (six) hours as needed for mild pain. Patient not taking: Reported on 11/03/2020 10/24/20   McVey, Ivette Marks A, CNM  coconut oil OIL Apply 1 application topically as needed. Patient not taking: Reported on 11/03/2020 10/24/20   McVey, Georges Kings, CNM  ferrous sulfate 325 (65 FE) MG tablet Take 1 tablet (325 mg total) by mouth 2 (two) times daily with a meal. For anemia, take with Vitamin C 10/24/20 12/23/20  McVey, Georges Kings, CNM  ibuprofen (ADVIL) 600 MG tablet Take 1 tablet (600 mg total) by mouth every 6 (six)  hours as needed for fever or headache. 10/24/20   McVey, Georges Kings, CNM  NIFEdipine (ADALAT CC) 30 MG 24 hr tablet Take 1 tablet (30 mg total) by mouth daily. 10/24/20   McVey, Georges Kings, CNM  Prenatal Vit-Fe Fumarate-FA (PRENATAL MULTIVITAMIN) TABS tablet Take 1 tablet by mouth daily at 12 noon. Patient not taking: Reported on 11/03/2020    [provider]  senna-docusate (SENOKOT-S) 8.6-50 MG tablet Take 2 tablets by mouth daily. Patient not taking: Reported on 11/03/2020 10/24/20   McVey, Georges Kings, CNM  simethicone (MYLICON) 80 MG chewable tablet Chew 1 tablet (80 mg total) by mouth 3 (three) times daily after meals. Patient not taking: Reported on 11/03/2020 10/24/20   McVey, Georges Kings, CNM      Allergies    Patient has no known allergies.    Review of Systems   Review of Systems  Gastrointestinal:  Positive for abdominal pain.    Physical Exam Updated Vital Signs BP 109/80   Pulse 77   Temp 98.5 F (36.9 C) (Oral)   Resp 18   Ht 5\' 9"  (1.753 m)   Wt 117.9 kg   SpO2 100%   BMI 38.40 kg/m  Physical Exam Vitals and nursing note reviewed.  Constitutional:      General: She is not in acute distress.    Appearance: She is well-developed.  HENT:     Head: Normocephalic and atraumatic.  Eyes:     Conjunctiva/sclera: Conjunctivae normal.  Cardiovascular:     Rate and Rhythm: Normal rate and regular rhythm.     Heart sounds: No murmur heard. Pulmonary:     Effort: Pulmonary effort is normal. No respiratory distress.     Breath sounds: Normal breath sounds.  Abdominal:     Palpations: Abdomen is soft. There is no mass.     Tenderness: There is no abdominal tenderness. Negative signs include Murphy's sign and McBurney's sign.     Comments: Mild suprapubic and left lower quadrant pain to palpation with no rebound tenderness or guarding.  No CVA tenderness.  Musculoskeletal:        General: No swelling.     Cervical back: Neck supple.  Skin:    General: Skin is warm and  dry.     Capillary Refill: Capillary refill takes less than 2 seconds.  Neurological:     Mental Status: She is alert.  Psychiatric:        Mood and Affect: Mood normal.     ED Results / Procedures / Treatments   Labs (all labs ordered are listed, but only abnormal results are displayed) Labs Reviewed  COMPREHENSIVE METABOLIC PANEL WITH GFR - Abnormal; Notable for the following components:      Result Value   CO2 21 (*)    Glucose, Bld 118 (*)    All other components within normal limits  CBC - Abnormal; Notable for the following components:   WBC 17.1 (*)    All other components within normal limits  URINALYSIS, ROUTINE W REFLEX MICROSCOPIC - Abnormal; Notable for the following components:   Color, Urine AMBER (*)    APPearance CLOUDY (*)    Hgb urine dipstick MODERATE (*)    Ketones, ur 5 (*)    Protein, ur 100 (*)    Nitrite POSITIVE (*)    Leukocytes,Ua MODERATE (*)    All other components within normal limits  LIPASE, BLOOD  HCG, SERUM, QUALITATIVE  GC/CHLAMYDIA PROBE AMP ([place]) NOT AT [facility]    EKG None  Radiology CT ABDOMEN PELVIS W CONTRAST Result Date: 09/09/2023 CLINICAL DATA:  Acute abdominal pain EXAM: CT ABDOMEN AND PELVIS WITH CONTRAST TECHNIQUE: Multidetector CT imaging of the abdomen and pelvis was performed using the standard protocol following bolus administration of intravenous contrast. RADIATION DOSE REDUCTION: This exam was performed according to the departmental dose-optimization program which includes automated exposure control, adjustment of the mA and/or kV according to patient size and/or use of iterative reconstruction technique. CONTRAST:  75mL OMNIPAQUE IOHEXOL 350 MG/ML SOLN COMPARISON:  None Available. FINDINGS: Lower chest: No acute abnormality. Hepatobiliary: No focal liver abnormality is seen. No gallstones, gallbladder wall thickening, or biliary dilatation. Pancreas: Unremarkable. No pancreatic ductal dilatation or surrounding  inflammatory changes. Spleen: Normal in size without focal abnormality. Adrenals/Urinary Tract: Adrenal glands show a 1 cm lesion within the central portion of the left adrenal gland likely representing a small adenoma. No further follow-up is recommended. No renal calculi are seen. Mild fullness of the left collecting system and ureter is seen with Periureteral stranding. This extends to the level of the ureterovesical junction. No definitive stone is seen. These changes may be related to a recently passed stone or possibly underlying UTI. Bladder is well distended. Stomach/Bowel: No obstructive or inflammatory changes of the colon are noted. The appendix is within normal limits. Small bowel and stomach are unremarkable. Vascular/Lymphatic: No significant vascular findings are present. No enlarged abdominal or  pelvic lymph nodes. Reproductive: Uterus and bilateral adnexa are unremarkable. Other: No abdominal wall hernia or abnormality. No abdominopelvic ascites. Musculoskeletal: No acute or significant osseous findings. IMPRESSION: Fullness of the left collecting system and ureter with surrounding inflammatory change. This may be related to recently passed stone or underlying UTI. Correlate with laboratory values. Left adrenal mass measuring 1 cm. No follow-up imaging is recommended. JACR 2017 Aug; 14(8):1038-44, JCAT 2016 Mar-Apr; 40(2):194-200, Urol J 2006 Spring; 3(2):71-4. No other focal abnormality is noted. Electronically Signed   By: Violeta Grey M.D.   On: 09/09/2023 20:00    Procedures Procedures    Medications Ordered in ED Medications  cefTRIAXone (ROCEPHIN) 2 g in sodium chloride 0.9 % 100 mL IVPB (0 g Intravenous Stopped 09/09/23 1905)  iohexol (OMNIPAQUE) 350 MG/ML injection 75 mL (75 mLs Intravenous Contrast Given 09/09/23 1939)    ED Course/ Medical Decision Making/ A&P Clinical Course as of 09/09/23 2049  Sun Sep 09, 2023  1745 Stable  28 YOF with abdominal pain LLQ SP discomfort   [CC]    Clinical Course User Index [CC] Onetha Bile, MD                                 Medical Decision Making Amount and/or Complexity of Data Reviewed Labs: ordered. Radiology: ordered.  Risk Prescription drug management.   Patient is alert, afebrile, and hemodynamically stable in the ED in no acute distress.  Physical exam as noted above.  Great concern is UTI versus pyelonephritis.  Also considering ovarian torsion, ectopic pregnancy, TOA, ureterolithiasis.  Location not consistent with appendicitis though could be atypical.  Labs from triage demonstrate leukocytosis of 17.1, unremarkable CMP, normal lipase, negative pregnancy test.  Urinalysis with sterile pyuria-positive nitrates and leukocytes esterase with no WBCs or bacteria.  Could be an atypical UTI but also STI, TOA or passed kidney stone. GC testing was obtained.  Will obtain CT imaging of the abdomen to assess for TOA and ureterolithiasis.  Patient was given Rocephin in the interim.  CT imaging was performed and demonstrated left ureteral fullness with stranding, potentially passed stone versus UTI.  Will complete course of STI treatment with outpatient doxycycline, and Keflex for UTI.  I explained workup and assessment to patient, and she is agreeable.  Suggested PCP follow-up.  Strict return precautions are given, and patient was discharged in stable condition.          Final Clinical Impression(s) / ED Diagnoses Final diagnoses:  Left lower quadrant abdominal pain    Rx / DC Orders ED Discharge Orders          Ordered    cephALEXin (KEFLEX) 500 MG capsule  4 times daily        09/09/23 2041    doxycycline (VIBRAMYCIN) 100 MG capsule  2 times daily        09/09/23 2041              Lorain Robson, MD 09/09/23 2050    Onetha Bile, MD 09/09/23 2342

## 2023-09-09 NOTE — Discharge Instructions (Addendum)
 You were seen today for left lower sided abdominal pain.  Workup today suggested possible urinary infection.  Please take Keflex and doxycycline as prescribed.  You can take Tylenol Motrin as needed for pain.  Follow-up with your PCP.  Please return to the ED for any emergency medical care.

## 2023-09-09 NOTE — ED Triage Notes (Signed)
 PT arrived POV from home c/o waking up with left sided abdominal pain that is from the top of the abdominal all the way down to her vagina. Pt states she has chills and feels achy. Pt also states when she pees it is pink tinged like she has blood in her urine.

## 2023-09-10 LAB — GC/CHLAMYDIA PROBE AMP (~~LOC~~) NOT AT ARMC
Chlamydia: NEGATIVE
Comment: NEGATIVE
Comment: NORMAL
Neisseria Gonorrhea: NEGATIVE

## 2023-10-31 ENCOUNTER — Ambulatory Visit: Admitting: Family Medicine
